# Patient Record
Sex: Female | Born: 1975 | Race: Asian | Hispanic: No | Marital: Married | Smoking: Never smoker
Health system: Southern US, Community
[De-identification: ages and names within clinical notes are randomized; demographics above are authoritative.]

## PROBLEM LIST (undated history)

## (undated) DIAGNOSIS — Z98891 History of uterine scar from previous surgery: Secondary | ICD-10-CM

## (undated) DIAGNOSIS — IMO0002 Reserved for concepts with insufficient information to code with codable children: Secondary | ICD-10-CM

## (undated) DIAGNOSIS — K219 Gastro-esophageal reflux disease without esophagitis: Secondary | ICD-10-CM

## (undated) HISTORY — PX: WISDOM TOOTH EXTRACTION: SHX21

## (undated) HISTORY — DX: Reserved for concepts with insufficient information to code with codable children: IMO0002

## (undated) HISTORY — DX: Gastro-esophageal reflux disease without esophagitis: K21.9

## (undated) HISTORY — PX: UPPER GASTROINTESTINAL ENDOSCOPY: SHX188

---

## 2014-07-01 DIAGNOSIS — K2970 Gastritis, unspecified, without bleeding: Secondary | ICD-10-CM

## 2014-07-01 HISTORY — DX: Gastritis, unspecified, without bleeding: K29.70

## 2014-12-09 ENCOUNTER — Ambulatory Visit (INDEPENDENT_AMBULATORY_CARE_PROVIDER_SITE_OTHER): Payer: 59 | Admitting: Family

## 2014-12-09 ENCOUNTER — Encounter: Payer: Self-pay | Admitting: Family

## 2014-12-09 ENCOUNTER — Other Ambulatory Visit (INDEPENDENT_AMBULATORY_CARE_PROVIDER_SITE_OTHER): Payer: 59

## 2014-12-09 VITALS — BP 100/68 | HR 87 | Temp 98.0°F | Resp 18 | Ht 62.0 in | Wt <= 1120 oz

## 2014-12-09 DIAGNOSIS — R1013 Epigastric pain: Secondary | ICD-10-CM

## 2014-12-09 LAB — LIPASE: Lipase: 17 U/L (ref 11.0–59.0)

## 2014-12-09 LAB — HEPATIC FUNCTION PANEL
ALBUMIN: 4 g/dL (ref 3.5–5.2)
ALK PHOS: 54 U/L (ref 39–117)
ALT: 20 U/L (ref 0–35)
AST: 13 U/L (ref 0–37)
BILIRUBIN TOTAL: 0.4 mg/dL (ref 0.2–1.2)
Bilirubin, Direct: 0.1 mg/dL (ref 0.0–0.3)
Total Protein: 6.5 g/dL (ref 6.0–8.3)

## 2014-12-09 LAB — AMYLASE: AMYLASE: 45 U/L (ref 27–131)

## 2014-12-09 LAB — H. PYLORI ANTIBODY, IGG: H PYLORI IGG: NEGATIVE

## 2014-12-09 MED ORDER — NORETHINDRONE ACET-ETHINYL EST 1-20 MG-MCG PO TABS: 1.0000 | ORAL_TABLET | Freq: Every day | ORAL | Status: DC

## 2014-12-09 MED ORDER — OMEPRAZOLE 20 MG PO CPDR: 20.0000 mg | DELAYED_RELEASE_CAPSULE | Freq: Every day | ORAL | Status: DC

## 2014-12-09 NOTE — Progress Notes (Signed)
Pre visit review using our clinic review tool, if applicable. No additional management support is needed unless otherwise documented below in the visit note. 

## 2014-12-09 NOTE — Patient Instructions (Addendum)
Gastroesophageal Reflux Disease, Adult Gastroesophageal reflux disease (GERD) happens when acid from your stomach flows up into the esophagus. When acid comes in contact with the esophagus, the acid causes soreness (inflammation) in the esophagus. Over time, GERD may create small holes (ulcers) in the lining of the esophagus. CAUSES   Increased body weight. This puts pressure on the stomach, making acid rise from the stomach into the esophagus.  Smoking. This increases acid production in the stomach.  Drinking alcohol. This causes decreased pressure in the lower esophageal sphincter (valve or ring of muscle between the esophagus and stomach), allowing acid from the stomach into the esophagus.  Late evening meals and a full stomach. This increases pressure and acid production in the stomach.  A malformed lower esophageal sphincter. Sometimes, no cause is found. SYMPTOMS   Burning pain in the lower part of the mid-chest behind the breastbone and in the mid-stomach area. This may occur twice a week or more often.  Trouble swallowing.  Sore throat.  Dry cough.  Asthma-like symptoms including chest tightness, shortness of breath, or wheezing. DIAGNOSIS  Your caregiver may be able to diagnose GERD based on your symptoms. In some cases, X-rays and other tests may be done to check for complications or to check the condition of your stomach and esophagus. TREATMENT  Your caregiver may recommend over-the-counter or prescription medicines to help decrease acid production. Ask your caregiver before starting or adding any new medicines.  HOME CARE INSTRUCTIONS   Change the factors that you can control. Ask your caregiver for guidance concerning weight loss, quitting smoking, and alcohol consumption.  Avoid foods and drinks that make your symptoms worse, such as:  Caffeine or alcoholic drinks.  Chocolate.  Peppermint or mint flavorings.  Garlic and onions.  Spicy foods.  Citrus fruits,  such as oranges, lemons, or limes.  Tomato-based foods such as sauce, chili, salsa, and pizza.  Fried and fatty foods.  Avoid lying down for the 3 hours prior to your bedtime or prior to taking a nap.  Eat small, frequent meals instead of large meals.  Wear loose-fitting clothing. Do not wear anything tight around your waist that causes pressure on your stomach.  Raise the head of your bed 6 to 8 inches with wood blocks to help you sleep. Extra pillows will not help.  Only take over-the-counter or prescription medicines for pain, discomfort, or fever as directed by your caregiver.  Do not take aspirin, ibuprofen, or other nonsteroidal anti-inflammatory drugs (NSAIDs). SEEK IMMEDIATE MEDICAL CARE IF:   You have pain in your arms, neck, jaw, teeth, or back.  Your pain increases or changes in intensity or duration.  You develop nausea, vomiting, or sweating (diaphoresis).  You develop shortness of breath, or you faint.  Your vomit is green, yellow, black, or looks like coffee grounds or blood.  Your stool is red, bloody, or black. These symptoms could be signs of other problems, such as heart disease, gastric bleeding, or esophageal bleeding. MAKE SURE YOU:   Understand these instructions.  Will watch your condition.  Will get help right away if you are not doing well or get worse. Document Released: 03/27/2005 Document Revised: 09/09/2011 Document Reviewed: 01/04/2011 [facility] Patient Information 2015 [place], [place]. This information is not intended to replace advice given to you by your health care provider. Make sure you discuss any questions you have with your health care provider.  Peptic Ulcer A peptic ulcer is a sore in the lining of your esophagus (esophageal  ulcer), stomach (gastric ulcer), or in the first part of your small intestine (duodenal ulcer). The ulcer causes erosion into the deeper tissue. CAUSES  Normally, the lining of the stomach and the small  intestine protects itself from the acid that digests food. The protective lining can be damaged by:  An infection caused by a bacterium called Helicobacter pylori (H. pylori).  Regular use of nonsteroidal anti-inflammatory drugs (NSAIDs), such as ibuprofen or aspirin.  Smoking tobacco. Other risk factors include being older than 38, drinking alcohol excessively, and having a family history of ulcer disease.  SYMPTOMS   Burning pain or gnawing in the area between the chest and the belly button.  Heartburn.  Nausea and vomiting.  Bloating. The pain can be worse on an empty stomach and at night. If the ulcer results in bleeding, it can cause:  Black, tarry stools.  Vomiting of bright red blood.  Vomiting of coffee-ground-looking materials. DIAGNOSIS  A diagnosis is usually made based upon your history and an exam. Other tests and procedures may be performed to find the cause of the ulcer. Finding a cause will help determine the best treatment. Tests and procedures may include:  Blood tests, stool tests, or breath tests to check for the bacterium H. pylori.  An upper gastrointestinal (GI) series of the esophagus, stomach, and small intestine.  An endoscopy to examine the esophagus, stomach, and small intestine.  A biopsy. TREATMENT  Treatment may include:  Eliminating the cause of the ulcer, such as smoking, NSAIDs, or alcohol.  Medicines to reduce the amount of acid in your digestive tract.  Antibiotic medicines if the ulcer is caused by the H. pylori bacterium.  An upper endoscopy to treat a bleeding ulcer.  Surgery if the bleeding is severe or if the ulcer created a hole somewhere in the digestive system. HOME CARE INSTRUCTIONS   Avoid tobacco, alcohol, and caffeine. Smoking can increase the acid in the stomach, and continued smoking will impair the healing of ulcers.  Avoid foods and drinks that seem to cause discomfort or aggravate your ulcer.  Only take medicines  as directed by your caregiver. Do not substitute over-the-counter medicines for prescription medicines without talking to your caregiver.  Keep any follow-up appointments and tests as directed. SEEK MEDICAL CARE IF:   Your do not improve within 7 days of starting treatment.  You have ongoing indigestion or heartburn. SEEK IMMEDIATE MEDICAL CARE IF:   You have sudden, sharp, or persistent abdominal pain.  You have bloody or dark black, tarry stools.  You vomit blood or vomit that looks like coffee grounds.  You become light-headed, weak, or feel faint.  You become sweaty or clammy. MAKE SURE YOU:   Understand these instructions.  Will watch your condition.  Will get help right away if you are not doing well or get worse. Document Released: 06/14/2000 Document Revised: 11/01/2013 Document Reviewed: 01/15/2012 [facility] Patient Information 2015 [place], [place]. This information is not intended to replace advice given to you by your health care provider. Make sure you discuss any questions you have with your health care provider.

## 2014-12-09 NOTE — Progress Notes (Signed)
Subjective:    Patient ID: Jessica Warner, female    DOB: 1975-07-02, 39 y.o.   MRN: [id number]  Chief Complaint  Patient presents with  . Establish Care    has been having abdominal pain for x2 months, its a constant pain, she feels it in the morning and before she goes to bed    HPI:  Jessica Warner is a 39 y.o. female with no significant PMH who presents today for an office visit to establish care.   1.) Abominal pain - This is a new problem. Associated symptom of pain located in the epigastric region of her stomach has been going on for a couple of months. Notes that she can feel it in the morning, at night before bed and on occasion when she eats too much. Pain is described as dull and gas like pain or fullness. Denies any constipation or diarrhea. Has not tried any over the counter medications or other treatments. Severity of the pain is rated 5-7/10. Pain is there constantly.    No Known Allergies   No outpatient prescriptions prior to visit.   No facility-administered medications prior to visit.     History reviewed. No pertinent past medical history.   Past Surgical History  Procedure Laterality Date  . Cesarean section       Family History  Problem Relation Age of Onset  . Healthy Mother   . Healthy Father   . Healthy Maternal Grandmother   . Healthy Maternal Grandfather   . Healthy Paternal Grandmother   . Healthy Paternal Grandfather      History   Social History  . Marital Status: Married    Spouse Name: N/A  . Number of Children: 1  . Years of Education: 68   Occupational History  . 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 Smoking status: Never Smoker   . Smokeless tobacco: Never Used  . Alcohol Use: No  . Drug Use: No  . Sexual Activity: Not on file   Other Topics Concern  . Not on file   Social History Narrative   Fun: Reading, cooking, walking.   Denies religious beliefs effecting health care.     Review of  Systems  Constitutional: Negative for fever and chills.  Gastrointestinal: Negative for nausea, vomiting, abdominal pain, constipation and abdominal distention.      Objective:    BP 100/68 mmHg  Pulse 87  Temp(Src) 98 F (36.7 C) (Oral)  Resp 18  Ht 5\' 2"  (1.575 m)  Wt 11 lb 1.9 oz (5.044 kg)  BMI 2.03 kg/m2  SpO2 99% Nursing note and vital signs reviewed.  Physical Exam  Constitutional: She is oriented to person, place, and time. She appears well-developed and well-nourished. No distress.  Cardiovascular: Normal rate, regular rhythm, normal heart sounds and intact distal pulses.   Pulmonary/Chest: Effort normal and breath sounds normal.  Abdominal: Soft. Normal appearance and bowel sounds are normal. She exhibits no mass. There is no hepatosplenomegaly. There is tenderness in the epigastric area. There is no rigidity, no rebound, no guarding, no tenderness at McBurney's point and negative Murphy's sign.  Neurological: She is alert and oriented to person, place, and time.  Skin: Skin is warm and dry.  Psychiatric: She has a normal mood and affect. Her behavior is normal. Judgment and thought content normal.       Assessment & Plan:   Problem List Items Addressed This Visit  Other   Epigastric abdominal pain - Primary    Symptoms and exam consistent with GERD, however cannot rule out peptic ulcer disease. Obtain Amylase, H. Pylori, hepatic function panel and lipase. Start omeprazole. Follow up pending lab work.       Relevant Orders   H. pylori antibody, IgG   Hepatic function panel   Lipase   Amylase

## 2014-12-09 NOTE — Assessment & Plan Note (Signed)
Symptoms and exam consistent with GERD, however cannot rule out peptic ulcer disease. Obtain Amylase, H. Pylori, hepatic function panel and lipase. Start omeprazole. Follow up pending lab work.

## 2014-12-10 ENCOUNTER — Telehealth: Payer: Self-pay | Admitting: Family

## 2014-12-10 NOTE — Telephone Encounter (Signed)
Please inform the patient that her blood work shows that her H. pylori which indicates potential peptic ulcer disease is negative. Also, her liver function amylase and lipase which also check pancreas and gallbladder are normal as well. Therefore please continue with the prescribed omeprazole and follow up if symptoms worsen or fail to improve. May also try a probiotic such as Activia yogurt or Align.

## 2014-12-12 NOTE — Telephone Encounter (Signed)
Pt aware of results 

## 2014-12-12 NOTE — Telephone Encounter (Signed)
LVM for pt to call back.

## 2014-12-23 ENCOUNTER — Ambulatory Visit: Payer: 59 | Admitting: Family

## 2015-01-11 ENCOUNTER — Ambulatory Visit (INDEPENDENT_AMBULATORY_CARE_PROVIDER_SITE_OTHER): Payer: 59 | Admitting: Family

## 2015-01-11 ENCOUNTER — Encounter: Payer: Self-pay | Admitting: Family

## 2015-01-11 VITALS — BP 102/74 | HR 91 | Temp 98.3°F | Resp 18 | Ht 62.0 in | Wt 114.0 lb

## 2015-01-11 DIAGNOSIS — R1013 Epigastric pain: Secondary | ICD-10-CM | POA: Diagnosis not present

## 2015-01-11 MED ORDER — OMEPRAZOLE 40 MG PO CPDR: 40.0000 mg | DELAYED_RELEASE_CAPSULE | Freq: Every day | ORAL | Status: DC

## 2015-01-11 NOTE — Assessment & Plan Note (Signed)
Symptoms remain unchanged with addition of omeprazole. Increase omeprazole to 40 mg daily. Concern remains for potential ulcer. Refer to GI for for further evaluation and possible endoscopy if indicated.

## 2015-01-11 NOTE — Progress Notes (Signed)
Pre visit review using our clinic review tool, if applicable. No additional management support is needed unless otherwise documented below in the visit note. 

## 2015-01-11 NOTE — Patient Instructions (Addendum)
Thank you for choosing [facility].  Summary/Instructions:  Increase omeprazole to 40 mg daily.  A referral to GI has been placed and the office will be in touch.   Your prescription(s) have been submitted to your pharmacy or been printed and provided for you. Please take as directed and contact our office if you believe you are having problem(s) with the medication(s) or have any questions.  If your symptoms worsen or fail to improve, please contact our office for further instruction, or in case of emergency go directly to the emergency room at the closest medical facility.

## 2015-01-11 NOTE — Progress Notes (Signed)
   Subjective:    Patient ID: Jessica Warner, female    DOB: [birth year], 39 y.o.   MRN: [id number]  Chief Complaint  Patient presents with  . Follow-up    still having same symptoms with abdominal pain, nothing has really progressed since last visit    HPI:  Jessica Warner is a 39 y.o. female with a PMH of epigastric pain who presents today for an office follow.   Previously seen in the office with epigastric pain and lab work including H. Pylori, lipase and amylase were all negative. Continues to experience the associated symptom of sharp epigastric pain. Indicates that she notices that the pain occurs when her stomach is empty and notes that there is improvement a few hours after eating.   No Known Allergies   Current Outpatient Prescriptions on File Prior to Visit  Medication Sig Dispense Refill  . norethindrone-ethinyl estradiol (JUNEL 1/20) 1-20 MG-MCG tablet Take 1 tablet by mouth daily. 1 Package 11   No current facility-administered medications on file prior to visit.     Review of Systems  Constitutional: Negative for fever and chills.  Gastrointestinal: Positive for abdominal pain. Negative for nausea, vomiting and constipation.      Objective:    BP 102/74 mmHg  Pulse 91  Temp(Src) 98.3 F (36.8 C) (Oral)  Resp 18  Ht 5\' 2"  (1.575 m)  Wt 114 lb (51.71 kg)  BMI 20.85 kg/m2  SpO2 96% Nursing note and vital signs reviewed.  Physical Exam  Constitutional: She is oriented to person, place, and time. She appears well-developed and well-nourished. No distress.  Cardiovascular: Normal rate, regular rhythm, normal heart sounds and intact distal pulses.   Pulmonary/Chest: Effort normal and breath sounds normal.  Abdominal: Soft. Normal appearance and bowel sounds are normal. There is tenderness in the epigastric area. There is no rigidity, no rebound, no guarding, no CVA tenderness, no tenderness at McBurney's point and negative Murphy's sign.  Neurological: She is  alert and oriented to person, place, and time.  Skin: Skin is warm and dry.  Psychiatric: She has a normal mood and affect. Her behavior is normal. Judgment and thought content normal.       Assessment & Plan:   Problem List Items Addressed This Visit      Other   Epigastric abdominal pain - Primary    Symptoms remain unchanged with addition of omeprazole. Increase omeprazole to 40 mg daily. Concern remains for potential ulcer. Refer to GI for for further evaluation and possible endoscopy if indicated.      Relevant Medications   omeprazole (PRILOSEC) 40 MG capsule

## 2015-02-01 ENCOUNTER — Encounter: Payer: Self-pay | Admitting: Family

## 2015-02-01 DIAGNOSIS — R1013 Epigastric pain: Secondary | ICD-10-CM

## 2015-02-20 ENCOUNTER — Ambulatory Visit: Payer: 59 | Admitting: Gastroenterology

## 2015-03-14 ENCOUNTER — Ambulatory Visit (INDEPENDENT_AMBULATORY_CARE_PROVIDER_SITE_OTHER): Payer: 59 | Admitting: Gastroenterology

## 2015-03-14 ENCOUNTER — Other Ambulatory Visit (INDEPENDENT_AMBULATORY_CARE_PROVIDER_SITE_OTHER): Payer: 59

## 2015-03-14 ENCOUNTER — Encounter: Payer: Self-pay | Admitting: Gastroenterology

## 2015-03-14 VITALS — BP 90/60 | HR 92 | Ht 62.0 in | Wt 113.0 lb

## 2015-03-14 DIAGNOSIS — R1013 Epigastric pain: Secondary | ICD-10-CM | POA: Diagnosis not present

## 2015-03-14 DIAGNOSIS — R195 Other fecal abnormalities: Secondary | ICD-10-CM

## 2015-03-14 LAB — CBC WITH DIFFERENTIAL/PLATELET
Basophils Absolute: 0 10*3/uL (ref 0.0–0.1)
Basophils Relative: 0.7 % (ref 0.0–3.0)
EOS PCT: 0.9 % (ref 0.0–5.0)
Eosinophils Absolute: 0.1 10*3/uL (ref 0.0–0.7)
HCT: 40.8 % (ref 36.0–46.0)
HEMOGLOBIN: 13.8 g/dL (ref 12.0–15.0)
Lymphocytes Relative: 26.7 % (ref 12.0–46.0)
Lymphs Abs: 1.8 10*3/uL (ref 0.7–4.0)
MCHC: 33.7 g/dL (ref 30.0–36.0)
MCV: 90.3 fl (ref 78.0–100.0)
Monocytes Absolute: 0.4 10*3/uL (ref 0.1–1.0)
Monocytes Relative: 5.4 % (ref 3.0–12.0)
Neutro Abs: 4.5 10*3/uL (ref 1.4–7.7)
Neutrophils Relative %: 66.3 % (ref 43.0–77.0)
Platelets: 264 10*3/uL (ref 150.0–400.0)
RBC: 4.52 Mil/uL (ref 3.87–5.11)
RDW: 13.2 % (ref 11.5–15.5)
WBC: 6.8 10*3/uL (ref 4.0–10.5)

## 2015-03-14 LAB — BASIC METABOLIC PANEL
BUN: 13 mg/dL (ref 6–23)
CALCIUM: 9.3 mg/dL (ref 8.4–10.5)
CO2: 30 meq/L (ref 19–32)
CREATININE: 0.64 mg/dL (ref 0.40–1.20)
Chloride: 104 mEq/L (ref 96–112)
GFR: 110.25 mL/min (ref >60.00)
GLUCOSE: 93 mg/dL (ref 70–99)
Potassium: 3.8 mEq/L (ref 3.5–5.1)
Sodium: 140 mEq/L (ref 135–145)

## 2015-03-14 LAB — TSH: TSH: 1.27 u[IU]/mL (ref 0.35–4.50)

## 2015-03-14 NOTE — Progress Notes (Signed)
Reviewed and agree with management plan.  Curry Seefeldt T. Kamariya Blevens, MD FACG 

## 2015-03-14 NOTE — Progress Notes (Signed)
03/14/2015 Jessica Warner [id number] 02/15/1976   HISTORY OF PRESENT ILLNESS:  This is a pleasant 39 year old female who is new to our practice. 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 She moved here in January from [place], [place] for her work. She has been referred here by her PCP, Dr.Calone, for evaluation of epigastric abdominal pain. She reports the pain has been present since approximately March, so about 6 months now. It stays in her epigastrium and radiates on occasion to left upper quadrant. The pain is a constant dull pain, but worse in the mornings and when she has an empty stomach. Seems to improve with eating. She says that she had an EGD a couple of years ago through [facility] that was reportedly normal/negative. This is performed for complaints of similar pain. The pain then resolved on its own after a couple of months but has now returned again recently. She denies any NSAID use. H. pylori serology was negative recently. Amylase, lipase, and hepatic function panel were normal. She was placed on omeprazole 40 mg daily, which she took for approximately one month and then increased it to 2 pills daily for approximately 6 weeks, but had no improvement in her symptoms with that regimen. She denies any nausea, vomiting, weight loss, or heartburn/reflux.  She also reports some loose stools, which actually began prior to the complaints of abdominal pain. For the last several months her stools are always loose, but she only reports 1 bowel movement every day to every other day. She denies seeing any blood in her stools.   History reviewed. No pertinent past medical history. Past Surgical History  Procedure Laterality Date  . Cesarean section      reports that she has never smoked. She has never used smokeless tobacco. She reports that she does not drink alcohol or use illicit drugs. family history includes Healthy in her father, maternal grandfather, maternal grandmother,  mother, paternal grandfather, and paternal grandmother. There is no history of Kidney disease, Liver disease, Colon cancer, Esophageal cancer, or Pancreatic cancer. No Known Allergies    Outpatient Encounter Prescriptions as of 03/14/2015  Medication Sig  . norethindrone-ethinyl estradiol (JUNEL 1/20) 1-20 MG-MCG tablet Take 1 tablet by mouth daily.  . [DISCONTINUED] omeprazole (PRILOSEC) 40 MG capsule Take 1 capsule (40 mg total) by mouth daily.   No facility-administered encounter medications on file as of 03/14/2015.     REVIEW OF SYSTEMS  : All other systems reviewed and negative except where noted in the History of Present Illness.   PHYSICAL EXAM: BP 90/60 mmHg  Pulse 92  Ht 5\' 2"  (1.575 m)  Wt 113 lb (51.256 kg)  BMI 20.66 kg/m2 General: Well developed female in no acute distress Head: Normocephalic and atraumatic Eyes:  Sclerae anicteric, conjunctiva pink. Ears: Normal auditory acuity Lungs: Clear throughout to auscultation Heart: Regular rate and rhythm Abdomen: Soft, non-distended.  Normal bowel sounds.  Epigastric TTP without R/R/G. Musculoskeletal: Symmetrical with no gross deformities  Skin: No lesions on visible extremities Extremities: No edema  Neurological: Alert oriented x 4, grossly non-focal Psychological:  Alert and cooperative. Normal mood and affect  ASSESSMENT AND PLAN: -Epigastric abdominal pain:  6 months duration.  Had similar pain a couple of years ago with reportedly negative EGD at [facility].  Symptoms resolved until recently.  Hpylori serology negative as is hepatic function panel, lipase, and amylase.  No improvement on PPI.  Will repeat EGD to rule out ulcer disease, etc.  If EGD  negative then would consider CT scan of the abdomen.  We have requested EGD results/GI records from [facility]. -Loose stool:  Only one BM every day or every other day, but change in form/consistency.  Will check TSH.  Will also check CBC and BMP for completion.   Consider starting daily fiber supplement to bulk the stool.   CC:  Golden Circle, FNP

## 2015-03-14 NOTE — Patient Instructions (Signed)
You have been scheduled for an endoscopy. Please follow written instructions given to you at your visit today. If you use inhalers (even only as needed), please bring them with you on the day of your procedure. Your physician has requested that you go to www.startemmi.com and enter the access code given to you at your visit today. This web site gives a general overview about your procedure. However, you should still follow specific instructions given to you by our office regarding your preparation for the procedure.  Your physician has requested that you go to the basement for lab work before leaving today.   

## 2015-03-31 ENCOUNTER — Telehealth: Payer: Self-pay

## 2015-03-31 ENCOUNTER — Other Ambulatory Visit: Payer: Self-pay

## 2015-03-31 ENCOUNTER — Ambulatory Visit (AMBULATORY_SURGERY_CENTER): Payer: 59 | Admitting: Gastroenterology

## 2015-03-31 ENCOUNTER — Encounter: Payer: Self-pay | Admitting: Gastroenterology

## 2015-03-31 VITALS — BP 93/59 | HR 73 | Temp 98.5°F | Resp 16 | Ht 62.0 in | Wt 113.0 lb

## 2015-03-31 DIAGNOSIS — K2970 Gastritis, unspecified, without bleeding: Secondary | ICD-10-CM | POA: Diagnosis not present

## 2015-03-31 DIAGNOSIS — R1013 Epigastric pain: Secondary | ICD-10-CM | POA: Diagnosis present

## 2015-03-31 DIAGNOSIS — R1084 Generalized abdominal pain: Secondary | ICD-10-CM

## 2015-03-31 MED ORDER — PANTOPRAZOLE SODIUM 40 MG PO TBEC: 40.0000 mg | DELAYED_RELEASE_TABLET | Freq: Every day | ORAL | Status: DC

## 2015-03-31 MED ORDER — SODIUM CHLORIDE 0.9 % IV SOLN: 500.0000 mL | INTRAVENOUS | Status: DC

## 2015-03-31 NOTE — Progress Notes (Signed)
Transferred to recovery room. A/O x3, pleased with MAC.  VSS.  Report to Suzanne, RN. 

## 2015-03-31 NOTE — Progress Notes (Signed)
Called to room to assist during endoscopic procedure.  Patient ID and intended procedure confirmed with present staff. Received instructions for my participation in the procedure from the performing physician.  

## 2015-03-31 NOTE — Op Note (Signed)
[facility]  [address] [place], [address]   ENDOSCOPY PROCEDURE REPORT  PATIENT: Jessica Warner, Jessica Warner  MR#: [id number] BIRTHDATE: 06-27-1976 , 39  yrs. old GENDER: female ENDOSCOPIST: Ladene Artist, MD, [facility] REFERRED BY:  Mauricio Po, FNP PROCEDURE DATE:  03/31/2015 PROCEDURE:  EGD w/ biopsy ASA CLASS:     Class I INDICATIONS:  epigastric pain. MEDICATIONS: Monitored anesthesia care and Propofol 150 mg IV TOPICAL ANESTHETIC: none DESCRIPTION OF PROCEDURE: After the risks benefits and alternatives of the procedure were thoroughly explained, informed consent was obtained.  The LB [id number] endoscope was introduced through the mouth and advanced to the second portion of the duodenum , Without limitations.  The instrument was slowly withdrawn as the mucosa was fully examined.    STOMACH: Mild gastritis was found in the gastric body and gastric antrum. A few small erosions and mild erythema. Multiple biopsies were performed.   The stomach otherwise appeared normal. ESOPHAGUS: The mucosa of the esophagus appeared normal. DUODENUM: The duodenal mucosa showed no abnormalities in the bulb and 2nd part of the duodenum.  Retroflexed views revealed no abnormalities.  The scope was then withdrawn from the patient and the procedure completed.  COMPLICATIONS: There were no immediate complications.  ENDOSCOPIC IMPRESSION: 1.   Mild gastritis in the gastric body and gastric antrum; multiple biopsies performed 2.   The EGD otherwise appeared normal  RECOMMENDATIONS: 1.  Avoid NSAIDS 2.  PPI qam: pantoprazole 40 mg po qam, 1 year of refills 3.  Schedule abdominal ultrasound 4.  Await pathology 5.  Office visit in 6 weeks  eSigned:  Ladene Artist, MD, [facility] 03/31/2015 10:16 AM

## 2015-03-31 NOTE — Patient Instructions (Signed)
YOU HAD AN ENDOSCOPIC PROCEDURE TODAY AT [facility]:   Refer to the procedure report that was given to you for any specific questions about what was found during the examination.  If the procedure report does not answer your questions, please call your gastroenterologist to clarify.  If you requested that your care partner not be given the details of your procedure findings, then the procedure report has been included in a sealed envelope for you to review at your convenience later.  YOU SHOULD EXPECT: Some feelings of bloating in the abdomen. Passage of more gas than usual.  Walking can help get rid of the air that was put into your GI tract during the procedure and reduce the bloating.   Please Note:  You might notice some irritation and congestion in your nose or some drainage.  This is from the oxygen used during your procedure.  There is no need for concern and it should clear up in a day or so.  SYMPTOMS TO REPORT IMMEDIATELY:   Following upper endoscopy (EGD)  Vomiting of blood or coffee ground material  New chest pain or pain under the shoulder blades  Painful or persistently difficult swallowing  New shortness of breath  Fever of 100F or higher  Black, tarry-looking stools  For urgent or emergent issues, a gastroenterologist can be reached at any hour by calling [phone].   DIET: Your first meal following the procedure should be a small meal and then it is ok to progress to your normal diet. Heavy or fried foods are harder to digest and may make you feel nauseous or bloated.  Likewise, meals heavy in dairy and vegetables can increase bloating.  Drink plenty of fluids but you should avoid alcoholic beverages for 24 hours.  ACTIVITY:  You should plan to take it easy for the rest of today and you should NOT DRIVE or use heavy machinery until tomorrow (because of the sedation medicines used during the test).    FOLLOW UP: Our staff will call the number listed on  your records the next business day following your procedure to check on you and address any questions or concerns that you may have regarding the information given to you following your procedure. If we do not reach you, we will leave a message.  However, if you are feeling well and you are not experiencing any problems, there is no need to return our call.  We will assume that you have returned to your regular daily activities without incident.  If any biopsies were taken you will be contacted by phone or by letter within the next 1-3 weeks.  Please call us at [phone] if you have not heard about the biopsies in 3 weeks.    SIGNATURES/CONFIDENTIALITY: You and/or your care partner have signed paperwork which will be entered into your electronic medical record.  These signatures attest to the fact that that the information above on your After Visit Summary has been reviewed and is understood.  Full responsibility of the confidentiality of this discharge information lies with you and/or your care-partner.  Take your pantoprazole 30 mins before breakfast every morning.  Avoid NSAIDS per Dr. Fuller Plan. The 3rd floor staff will call you to arrange an ultrasound.

## 2015-03-31 NOTE — Telephone Encounter (Signed)
Pt scheduled for [place] of abdomen at [facility] 04/07/15@7 :30am, pt to arrive there at 7:15am. Pt to be NPO after midnight. Pt scheduled for OV with Dr. Fuller Plan 05/11/15@9 :45am. [facility] RN Vinnie Level to notify pt of appts.

## 2015-04-03 ENCOUNTER — Telehealth: Payer: Self-pay | Admitting: *Deleted

## 2015-04-03 NOTE — Telephone Encounter (Signed)
  Follow up Call-  Call back number 03/31/2015  Post procedure Call Back phone  # [phone] cell  Permission to leave phone message Yes     Patient questions:  Do you have a fever, pain , or abdominal swelling? No. Pain Score  0 *  Have you tolerated food without any problems? Yes.    Have you been able to return to your normal activities? Yes.    Do you have any questions about your discharge instructions: Diet   No. Medications  No. Follow up visit  No.  Do you have questions or concerns about your Care? No.  Actions: * If pain score is 4 or above: No action needed, pain <4.

## 2015-04-04 ENCOUNTER — Encounter: Payer: Self-pay | Admitting: Gastroenterology

## 2015-04-07 ENCOUNTER — Ambulatory Visit (HOSPITAL_COMMUNITY)
Admission: RE | Admit: 2015-04-07 | Discharge: 2015-04-07 | Disposition: A | Discharge status: Home / Self Care | Payer: 59 | Source: Ambulatory Visit | Attending: Gastroenterology | Admitting: Gastroenterology

## 2015-04-07 DIAGNOSIS — R1084 Generalized abdominal pain: Secondary | ICD-10-CM

## 2015-04-07 DIAGNOSIS — R109 Unspecified abdominal pain: Secondary | ICD-10-CM | POA: Diagnosis present

## 2015-05-10 ENCOUNTER — Telehealth: Payer: Self-pay | Admitting: Family

## 2015-05-10 NOTE — Telephone Encounter (Signed)
Informed pt on vm °

## 2015-05-10 NOTE — Telephone Encounter (Signed)
Ok with me 

## 2015-05-10 NOTE — Telephone Encounter (Signed)
Patient would like to transfer from [facility] to Dr. Quay Burow. She is looking for a female provider. Please advise

## 2015-05-11 ENCOUNTER — Encounter: Payer: Self-pay | Admitting: Gastroenterology

## 2015-05-11 ENCOUNTER — Ambulatory Visit (INDEPENDENT_AMBULATORY_CARE_PROVIDER_SITE_OTHER): Payer: 59 | Admitting: Gastroenterology

## 2015-05-11 VITALS — BP 90/70 | HR 88 | Ht 62.0 in | Wt 113.1 lb

## 2015-05-11 DIAGNOSIS — R195 Other fecal abnormalities: Secondary | ICD-10-CM

## 2015-05-11 DIAGNOSIS — R1013 Epigastric pain: Secondary | ICD-10-CM

## 2015-05-11 DIAGNOSIS — R197 Diarrhea, unspecified: Secondary | ICD-10-CM

## 2015-05-11 MED ORDER — HYOSCYAMINE SULFATE 0.125 MG SL SUBL: SUBLINGUAL_TABLET | SUBLINGUAL | Status: DC

## 2015-05-11 NOTE — Patient Instructions (Signed)
We have sent the following medications to your pharmacy for you to pick up at your convenience:Levsin.  Remain on a lactose free diet x 7-10 days.   Start a probiotic such as Air traffic controller, phillips colon health daily for 2 weeks.   Stop taking your Protonix in one month.   Thank you for choosing me and [facility].  Pricilla Riffle. Dagoberto Ligas., MD., Marval Regal

## 2015-05-11 NOTE — Progress Notes (Addendum)
    History of Present Illness: This is a 39 year old female returning for follow-up of epigastric pain and loose stools. Both complaints have been ongoing for many months. Her epigastric pain has improved but not resolved on daily pantoprazole. 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 H pylori stain was negative. Recent abdominal ultrasound was normal. Recent blood work was normal. She has a bowel movement every day to every other day and she notes it is somewhat looser than it was a few years ago and this pattern has been present for at least 8 or 9 months without changing.  Current Medications, Allergies, Past Medical History, Past Surgical History, Family History and Social History were reviewed in [employer] record.  Physical Exam: General: Well developed, well nourished, no acute distress Head: Normocephalic and atraumatic Eyes:  sclerae anicteric, EOMI Ears: Normal auditory acuity Mouth: No deformity or lesions Lungs: Clear throughout to auscultation Heart: Regular rate and rhythm; no murmurs, rubs or bruits Abdomen: Soft, non tender and non distended. No masses, hepatosplenomegaly or hernias noted. Normal Bowel sounds Musculoskeletal: Symmetrical with no gross deformities  Pulses:  Normal pulses noted Extremities: No clubbing, cyanosis, edema or deformities noted Neurological: Alert oriented x 4, grossly nonfocal Psychological:  Alert and cooperative. Normal mood and affect  Assessment and Recommendations:  1. Epigastric pain, loose stools and gastritis. Continue pantoprazole for a 2 month course and then discontinue. Will resume pantoprazole if her symptoms worsen. Trial of Levsin 1-2 every 4 hours as needed for abdominal pain. Advised to begin a 7-10 day trial of a lactose free diet. If loose stools persist begin a trial of 3 different probiotics each for 2-3  weeks to determine if this will impact her looser stools. REV in 2-3 months.

## 2015-06-12 ENCOUNTER — Encounter: Payer: Self-pay | Admitting: Internal Medicine

## 2015-06-12 ENCOUNTER — Encounter: Payer: Self-pay | Admitting: Gastroenterology

## 2015-06-12 ENCOUNTER — Ambulatory Visit (INDEPENDENT_AMBULATORY_CARE_PROVIDER_SITE_OTHER): Payer: 59 | Admitting: Internal Medicine

## 2015-06-12 VITALS — BP 108/74 | HR 93 | Temp 98.1°F | Resp 16 | Wt 111.0 lb

## 2015-06-12 DIAGNOSIS — M549 Dorsalgia, unspecified: Secondary | ICD-10-CM

## 2015-06-12 DIAGNOSIS — R42 Dizziness and giddiness: Secondary | ICD-10-CM

## 2015-06-12 DIAGNOSIS — R1013 Epigastric pain: Secondary | ICD-10-CM | POA: Diagnosis not present

## 2015-06-12 DIAGNOSIS — R5383 Other fatigue: Secondary | ICD-10-CM | POA: Diagnosis not present

## 2015-06-12 DIAGNOSIS — G8929 Other chronic pain: Secondary | ICD-10-CM

## 2015-06-12 DIAGNOSIS — M546 Pain in thoracic spine: Secondary | ICD-10-CM | POA: Diagnosis not present

## 2015-06-12 NOTE — Progress Notes (Signed)
Subjective:    Patient ID: Jessica Warner, female    DOB: [birth year], 39 y.o.   MRN: [id number]  HPI  She is here to establish with a new primary care physician-she prefers to see a female. She has a couple of concerns today.  She feels tired at the end of day and sometimes in the morning. The fatigue is not consistent-it is on occasion.  She typically sleeps 6-7 hours at night, her average is probably 7. She feels her sleep quality is good. She is not currently exercising regularly, but that is because she recently fell and injured her tailbone. That is getting better and she plans on returning to regular exercise.  Epigastric abdominal pain: She has been experiencing epigastric pain. She did see GI and had an EGD done in September. She had some mild gastritis. She has been taking pantoprazole daily and she feels this has helped. She sometimes feels abdominal pain first thing in the morning, but typically eating helps. She will also have pain if she presses hard in the area. She does not have pain on a daily basis. She plans on following up with GI regarding what she should do next and if she should continue the medication.   Never back pain: She has been experiencing neck and upper back pain for a while. She feels it is chronic muscle tightening. She rubs the area and does some stretching and it does help. She does get some headaches on occasion may be related to the neck and back pain. She does sit at a computer for several hours during the day and she realizes that may influence it.   Two weeks ago she fell from stairs and hurt her tailbone.  It is getting better.    Dizziness: On occasion she will experience some dizziness. She denies any relation to head movements. She denies any relation to specific activities. It is intermittent and very transient.      Medications and allergies reviewed with patient and no updates were needed.  Patient Active Problem List   Diagnosis Date Noted  .  Abdominal pain, epigastric 03/14/2015  . Loose stools 03/14/2015  . Epigastric abdominal pain 12/09/2014    Current Outpatient Prescriptions on File Prior to Visit  Medication Sig Dispense Refill  . hyoscyamine (LEVSIN SL) 0.125 MG SL tablet Take 1-2 capsules by mouth every 4 hours as needed for abd pain 100 tablet 11  . norethindrone-ethinyl estradiol (JUNEL 1/20) 1-20 MG-MCG tablet Take 1 tablet by mouth daily. 1 Package 11  . pantoprazole (PROTONIX) 40 MG tablet Take 1 tablet (40 mg total) by mouth daily. 90 tablet 3   No current facility-administered medications on file prior to visit.    Past Medical History  Diagnosis Date  . GERD (gastroesophageal reflux disease)     Past Surgical History  Procedure Laterality Date  . Cesarean section    . Wisdom tooth extraction    . Upper gastrointestinal endoscopy      Social History   Social History  . Marital Status: Married    Spouse Name: N/A  . Number of Children: 1  . Years of Education: 30   Occupational History  . 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 Smoking status: Never Smoker   . Smokeless tobacco: Never Used  . Alcohol Use: No  . Drug Use: No  . Sexual Activity: Not on file   Other Topics Concern  . Not on file  Social History Narrative   Fun: Reading, cooking, walking.   Denies religious beliefs effecting health care.     Review of Systems  Constitutional: Negative for fever and chills.  Respiratory: Negative for cough, shortness of breath and wheezing.   Cardiovascular: Negative for chest pain, palpitations and leg swelling.  Gastrointestinal: Positive for abdominal pain.       No GERD  Musculoskeletal: Positive for neck pain.  Neurological: Positive for dizziness (occasional) and headaches (occasional). Negative for weakness and numbness.       Objective:   Filed Vitals:   06/12/15 1038  BP: 108/74  Pulse: 93  Temp: 98.1 F (36.7 C)  Resp: 16   Filed  Weights   06/12/15 1038  Weight: 111 lb (50.349 kg)   Body mass index is 20.3 kg/(m^2).   Physical Exam Constitutional: Appears well-developed and well-nourished. No distress.  Neck: B/L ear canals and TM normal, Neck supple. No tracheal deviation present. No thyromegaly present.  No carotid bruit. No cervical adenopathy.   Cardiovascular: Normal rate, regular rhythm and normal heart sounds.   No murmur heard. Pulmonary/Chest: Effort normal and breath sounds normal. No respiratory distress. No wheezes.  Abdomen: soft, minimal epigastric tenderness without rebound or guarding, no HSM Musculoskeletal: No edema.        Assessment & Plan:   Epigastric pain Related to mild gastritis, which is likely healed Continue pantoprazole She plans on following up with GI Reassured her her stomach has likely healed  Dizziness Occasional Likely in her ear Given that he is only on occasion very transient left evaluate further, but if it becomes more than she can refer to neurology or ENT  Fatigue Reviewed recent blood work-all normal Encouraged 7-8 hours of sleep at night Restart regular exercise  Upper back and neck muscle tightness, chronic Discussed possible physical therapy referral, massage therapy, seen a chiropractor, acupuncture Stretching, heat, avoiding certain activities that promote the pain or at least make sure her work space is ergonomic  Follow-up as needed

## 2015-06-12 NOTE — Progress Notes (Signed)
Pre visit review using our clinic review tool, if applicable. No additional management support is needed unless otherwise documented below in the visit note. 

## 2015-06-12 NOTE — Patient Instructions (Addendum)
  We have reviewed your prior records including labs and tests today.   No immunizations administered today.   Medications reviewed and updated.  No changes recommended at this time.    [facility] [address] [place], [place] [address] from [place]  [facility] [place], [place]

## 2015-06-13 MED ORDER — PANTOPRAZOLE SODIUM 40 MG PO TBEC: 40.0000 mg | DELAYED_RELEASE_TABLET | Freq: Every day | ORAL | Status: DC

## 2015-12-04 ENCOUNTER — Other Ambulatory Visit: Payer: Self-pay | Admitting: Family

## 2015-12-20 ENCOUNTER — Encounter: Payer: Self-pay | Admitting: Gastroenterology

## 2015-12-25 DIAGNOSIS — K2950 Unspecified chronic gastritis without bleeding: Secondary | ICD-10-CM | POA: Diagnosis not present

## 2015-12-25 DIAGNOSIS — R1012 Left upper quadrant pain: Secondary | ICD-10-CM | POA: Diagnosis not present

## 2015-12-25 DIAGNOSIS — R1013 Epigastric pain: Secondary | ICD-10-CM | POA: Diagnosis not present

## 2016-01-20 DIAGNOSIS — H5213 Myopia, bilateral: Secondary | ICD-10-CM | POA: Diagnosis not present

## 2016-01-26 DIAGNOSIS — K2930 Chronic superficial gastritis without bleeding: Secondary | ICD-10-CM | POA: Diagnosis not present

## 2016-01-26 DIAGNOSIS — R1012 Left upper quadrant pain: Secondary | ICD-10-CM | POA: Diagnosis not present

## 2016-01-26 DIAGNOSIS — R1013 Epigastric pain: Secondary | ICD-10-CM | POA: Diagnosis not present

## 2016-02-12 ENCOUNTER — Ambulatory Visit: Payer: 59 | Admitting: Gastroenterology

## 2016-03-16 DIAGNOSIS — R8290 Unspecified abnormal findings in urine: Secondary | ICD-10-CM | POA: Diagnosis not present

## 2016-03-16 DIAGNOSIS — R300 Dysuria: Secondary | ICD-10-CM | POA: Diagnosis not present

## 2016-04-03 ENCOUNTER — Encounter: Payer: Self-pay | Admitting: Gastroenterology

## 2016-04-03 ENCOUNTER — Ambulatory Visit (INDEPENDENT_AMBULATORY_CARE_PROVIDER_SITE_OTHER): Payer: 59 | Admitting: Gastroenterology

## 2016-04-03 VITALS — BP 102/62 | HR 68 | Ht 62.0 in | Wt 107.8 lb

## 2016-04-03 DIAGNOSIS — K2940 Chronic atrophic gastritis without bleeding: Secondary | ICD-10-CM

## 2016-04-03 DIAGNOSIS — R1013 Epigastric pain: Secondary | ICD-10-CM

## 2016-04-03 NOTE — Patient Instructions (Signed)
We will get your records from the [facility].   Thank you for choosing me and [facility].  Pricilla Riffle. Dagoberto Ligas., MD., Marval Regal

## 2016-04-03 NOTE — Progress Notes (Signed)
    History of Present Illness: This is a 40 year old female complaining of mild intermittent epigastric pain. She states that she has mild epigastric pain prior to meals and her symptoms are relieved with a snack or a meal. She states that she discontinued pantoprazole for a couple months and her symptoms were more frequent. She was started on omeprazole 40 mg daily which she thinks has improved her symptoms. She takes Carafate about twice a day and she is unsure whether it is beneficial. Has many questions about her gastritis, gastric biopsies and symptoms. She states she had an endoscopy performed at the [facility] about 3 years ago. She is unsure as to what was found and if biopsies were obtained. CBC, CMP, TSH, amylase, lipase in 11/2014 were unremarkable.   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  H pylori Ab negative 11/2014  Current Medications, Allergies, Past Medical History, Past Surgical History, Family History and Social History were reviewed in [employer] record.  Physical Exam: General: Well developed, well nourished, no acute distress Head: Normocephalic and atraumatic Eyes:  sclerae anicteric, EOMI Ears: Normal auditory acuity Mouth: No deformity or lesions Lungs: Clear throughout to auscultation Heart: Regular rate and rhythm; no murmurs, rubs or bruits Abdomen: Soft, non tender and non distended. No masses, hepatosplenomegaly or hernias noted. Normal Bowel sounds Musculoskeletal: Symmetrical with no gross deformities  Pulses:  Normal pulses noted Extremities: No clubbing, cyanosis, edema or deformities noted Neurological: Alert oriented x 4, grossly nonfocal Psychological:  Alert and cooperative. Normal mood and affect  Assessment and Recommendations:  1. 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 Continue  omeprazole 40 mg daily. Carafate 4 times a day before meals prn epigastric pain. If not helpful then a trial of Tums prn epigastric pain. Consider a trial of FDgard if symptoms not responsive to above measures. Patient reassured that no further evaluation is needed at this time. EGD in 03/2020 for surveillance of intestinal metaplasia. Attempt to obtain her prior GI records including EGD and possible biopsies from [place]. REV in 1 year.  I spent 15 minutes of face-to-face time with the patient. Greater than 50% of the time was spent counseling and coordinating care.

## 2016-09-12 ENCOUNTER — Ambulatory Visit (INDEPENDENT_AMBULATORY_CARE_PROVIDER_SITE_OTHER): Payer: 59 | Admitting: Internal Medicine

## 2016-09-12 ENCOUNTER — Other Ambulatory Visit (INDEPENDENT_AMBULATORY_CARE_PROVIDER_SITE_OTHER): Payer: 59

## 2016-09-12 ENCOUNTER — Encounter: Payer: Self-pay | Admitting: Internal Medicine

## 2016-09-12 VITALS — BP 100/64 | HR 98 | Temp 97.8°F | Resp 16 | Ht 62.0 in | Wt 107.0 lb

## 2016-09-12 DIAGNOSIS — Z0000 Encounter for general adult medical examination without abnormal findings: Secondary | ICD-10-CM | POA: Diagnosis not present

## 2016-09-12 LAB — COMPREHENSIVE METABOLIC PANEL
ALT: 16 U/L (ref 0–35)
AST: 17 U/L (ref 0–37)
Albumin: 4.3 g/dL (ref 3.5–5.2)
Alkaline Phosphatase: 51 U/L (ref 39–117)
BUN: 14 mg/dL (ref 6–23)
CHLORIDE: 104 meq/L (ref 96–112)
CO2: 29 meq/L (ref 19–32)
Calcium: 9.6 mg/dL (ref 8.4–10.5)
Creatinine, Ser: 0.65 mg/dL (ref 0.40–1.20)
GFR: 107.46 mL/min (ref >60.00)
GLUCOSE: 77 mg/dL (ref 70–99)
POTASSIUM: 3.8 meq/L (ref 3.5–5.1)
SODIUM: 142 meq/L (ref 135–145)
Total Bilirubin: 0.6 mg/dL (ref 0.2–1.2)
Total Protein: 6.9 g/dL (ref 6.0–8.3)

## 2016-09-12 LAB — CBC WITH DIFFERENTIAL/PLATELET
Basophils Absolute: 0 10*3/uL (ref 0.0–0.1)
Basophils Relative: 0.8 % (ref 0.0–3.0)
EOS PCT: 1.2 % (ref 0.0–5.0)
Eosinophils Absolute: 0.1 10*3/uL (ref 0.0–0.7)
HCT: 42.1 % (ref 36.0–46.0)
Hemoglobin: 13.9 g/dL (ref 12.0–15.0)
LYMPHS ABS: 1.7 10*3/uL (ref 0.7–4.0)
Lymphocytes Relative: 29.5 % (ref 12.0–46.0)
MCHC: 33.1 g/dL (ref 30.0–36.0)
MCV: 92.8 fl (ref 78.0–100.0)
MONO ABS: 0.4 10*3/uL (ref 0.1–1.0)
Monocytes Relative: 6.2 % (ref 3.0–12.0)
NEUTROS ABS: 3.7 10*3/uL (ref 1.4–7.7)
NEUTROS PCT: 62.3 % (ref 43.0–77.0)
Platelets: 254 10*3/uL (ref 150.0–400.0)
RBC: 4.54 Mil/uL (ref 3.87–5.11)
RDW: 13.6 % (ref 11.5–15.5)
WBC: 5.9 10*3/uL (ref 4.0–10.5)

## 2016-09-12 LAB — LIPID PANEL
CHOL/HDL RATIO: 3
Cholesterol: 174 mg/dL (ref 0–200)
HDL: 61 mg/dL (ref >39.00)
LDL CALC: 89 mg/dL (ref 0–99)
NONHDL: 112.61
Triglycerides: 117 mg/dL (ref 0.0–149.0)
VLDL: 23.4 mg/dL (ref 0.0–40.0)

## 2016-09-12 LAB — VITAMIN D 25 HYDROXY (VIT D DEFICIENCY, FRACTURES): VITD: 19.88 ng/mL — ABNORMAL LOW (ref 30.00–100.00)

## 2016-09-12 LAB — TSH: TSH: 1.37 u[IU]/mL (ref 0.35–4.50)

## 2016-09-12 NOTE — Progress Notes (Signed)
Pre visit review using our clinic review tool, if applicable. No additional management support is needed unless otherwise documented below in the visit note. 

## 2016-09-12 NOTE — Progress Notes (Signed)
Subjective:    Patient ID: Jessica Warner, female    DOB: [birth year], 41 y.o.   MRN: [id number]  HPI She is here for a physical exam.   Chronic neck and back pain: she feels it is related to stress.  She has tried massage but it is temporary relief. She is trying to stretch regularly.  She has used a heating pad.   She had some epigastric pain in the past year ans did see GI.  She was on omeprazole and carafate.  She did not feel the medications were helping and stopped them.  She has occasional epigastric pain.  She finds if she eats frequently it helps.   She is thinking about getting pregnant.  She is not established with a gyn.   Medications and allergies reviewed with patient and updated if appropriate.  Patient Active Problem List   Diagnosis Date Noted  . Abdominal pain, epigastric 03/14/2015  . Loose stools 03/14/2015    No current outpatient prescriptions on file prior to visit.   No current facility-administered medications on file prior to visit.     Past Medical History:  Diagnosis Date  . GERD (gastroesophageal reflux disease)     Past Surgical History:  Procedure Laterality Date  . CESAREAN SECTION    . UPPER GASTROINTESTINAL ENDOSCOPY    . WISDOM TOOTH EXTRACTION      Social History   Social History  . Marital status: Married    Spouse name: N/A  . Number of children: 1  . 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 Smoking status: Never Smoker  . Smokeless tobacco: Never Used  . Alcohol use No  . Drug use: No  . Sexual activity: Not Asked   Other Topics Concern  . None   Social History Narrative   Fun: Reading, cooking, walking.   Denies religious beliefs effecting health care.           Family History  Problem Relation Age of Onset  . Healthy Mother   . Healthy Father   . Healthy Maternal Grandmother   . Healthy Maternal Grandfather   . Healthy Paternal  Grandmother   . Healthy Paternal Grandfather   . Colon cancer Neg Hx   . Esophageal cancer Neg Hx   . Pancreatic cancer Neg Hx   . Rectal cancer Neg Hx   . Stomach cancer Neg Hx     Review of Systems  Constitutional: Negative for appetite change, chills, fatigue and fever.  Eyes: Negative for visual disturbance.  Respiratory: Negative for cough, shortness of breath and wheezing.   Cardiovascular: Negative for chest pain, palpitations and leg swelling.  Gastrointestinal: Negative for abdominal pain, blood in stool, constipation, diarrhea and nausea.       GERD - sometimes  Genitourinary: Negative for dysuria and hematuria.  Musculoskeletal: Positive for arthralgias (occasional), back pain and neck pain.  Skin: Negative for color change and rash.  Neurological: Positive for headaches (occasional). Negative for dizziness and light-headedness.  Psychiatric/Behavioral: Negative for dysphoric mood. The patient is not nervous/anxious.        Objective:   Vitals:   09/12/16 0814  BP: 100/64  Pulse: 98  Resp: 16  Temp: 97.8 F (36.6 C)   Filed Weights   09/12/16 0814  Weight: 107 lb (48.5 kg)   Body mass index is 19.57 kg/m.  Wt Readings from Last 3 Encounters:  09/12/16 107 lb (48.5 kg)  04/03/16 107 lb 12.8 oz (48.9 kg)  06/12/15 111 lb (50.3 kg)     Physical Exam Constitutional: She appears well-developed and well-nourished. No distress.  HENT:  Head: Normocephalic and atraumatic.  Right Ear: External ear normal. Normal ear canal and TM Left Ear: External ear normal.  Normal ear canal and TM Mouth/Throat: Oropharynx is clear and moist.  Eyes: Conjunctivae and EOM are normal.  Neck: Neck supple. No tracheal deviation present. No thyromegaly present.  No carotid bruit  Cardiovascular: Normal rate, regular rhythm and normal heart sounds.   No murmur heard.  No edema. Pulmonary/Chest: Effort normal and breath sounds normal. No respiratory distress. She has no wheezes.  She has no rales.  Breast: deferred to Gyn Abdominal: Soft. She exhibits no distension. There is no tenderness.  Lymphadenopathy: She has no cervical adenopathy.  Skin: Skin is warm and dry. She is not diaphoretic.  Psychiatric: She has a normal mood and affect. Her behavior is normal.         Assessment & Plan:   Physical exam: Screening blood work ordered Immunizations  Up to date through work Gyn  - not up to date - will schedule Exercise - irregular exercise - encouraged regular exercise Weight - normal BMI Skin   No concerns Substance abuse  None  Will establish with gyn  See Problem List for Assessment and Plan of chronic medical problems.

## 2016-09-12 NOTE — Patient Instructions (Addendum)
[facility] [address] [place], [place] [address] Across the street from [facility] [phone]  [facility] [address]  [place], [place]  [phone]  [facility] [address] [place], [place] [address] [phone]   Test(s) ordered today. Your results will be released to MyChart (or called to you) after review, usually within 72hours after test completion. If any changes need to be made, you will be notified at that same time.  All other Health Maintenance issues reviewed.   All recommended immunizations and age-appropriate screenings are up-to-date or discussed.  No immunizations administered today.   Medications reviewed and updated.  No changes recommended at this time.   Please followup in one year for a physical   Health Maintenance, Female Adopting a healthy lifestyle and getting preventive care can go a long way to promote health and wellness. Talk with your health care provider about what schedule of regular examinations is right for you. This is a good chance for you to check in with your provider about disease prevention and staying healthy. In between checkups, there are plenty of things you can do on your own. Experts have done a lot of research about which lifestyle changes and preventive measures are most likely to keep you healthy. Ask your health care provider for more information. Weight and diet Eat a healthy diet  Be sure to include plenty of vegetables, fruits, low-fat dairy products, and lean protein.  Do not eat a lot of foods high in solid fats, added sugars, or salt.  Get regular exercise. This is one of the most important things you can do for your health.  Most adults should exercise for at least 150 minutes each week. The exercise should increase your heart rate and make you sweat (moderate-intensity  exercise).  Most adults should also do strengthening exercises at least twice a week. This is in addition to the moderate-intensity exercise. Maintain a healthy weight  Body mass index (BMI) is a measurement that can be used to identify possible weight problems. It estimates body fat based on height and weight. Your health care provider can help determine your BMI and help you achieve or maintain a healthy weight.  For females 20 years of age and older:  A BMI below 18.5 is considered underweight.  A BMI of 18.5 to 24.9 is normal.  A BMI of 25 to 29.9 is considered overweight.  A BMI of 30 and above is considered obese. Watch levels of cholesterol and blood lipids  You should start having your blood tested for lipids and cholesterol at 40 years of age, then have this test every 5 years.  You may need to have your cholesterol levels checked more often if:  Your lipid or cholesterol levels are high.  You are older than 40 years of age.  You are at high risk for heart disease. Cancer screening Lung Cancer  Lung cancer screening is recommended for adults 55-80 years old who are at high risk for lung cancer because of a history of smoking.  A yearly low-dose CT scan of the lungs is recommended for people who:  Currently smoke.  Have quit within the past 15 years.  Have at least a 30-pack-year history of smoking. A pack year is smoking an average of one pack of cigarettes a day for 1 year.  Yearly screening should continue until it has been 15 years since you   quit.  Yearly screening should stop if you develop a health problem that would prevent you from having lung cancer treatment. Breast Cancer  Practice breast self-awareness. This means understanding how your breasts normally appear and feel.  It also means doing regular breast self-exams. Let your health care provider know about any changes, no matter how small.  If you are in your 20s or 30s, you should have a clinical  breast exam (CBE) by a health care provider every 1-3 years as part of a regular health exam.  If you are 40 or older, have a CBE every year. Also consider having a breast X-ray (mammogram) every year.  If you have a family history of breast cancer, talk to your health care provider about genetic screening.  If you are at high risk for breast cancer, talk to your health care provider about having an MRI and a mammogram every year.  Breast cancer gene (BRCA) assessment is recommended for women who have family members with BRCA-related cancers. BRCA-related cancers include:  Breast.  Ovarian.  Tubal.  Peritoneal cancers.  Results of the assessment will determine the need for genetic counseling and BRCA1 and BRCA2 testing. Cervical Cancer  Your health care provider may recommend that you be screened regularly for cancer of the pelvic organs (ovaries, uterus, and vagina). This screening involves a pelvic examination, including checking for microscopic changes to the surface of your cervix (Pap test). You may be encouraged to have this screening done every 3 years, beginning at age 21.  For women ages 30-65, health care providers may recommend pelvic exams and Pap testing every 3 years, or they may recommend the Pap and pelvic exam, combined with testing for human papilloma virus (HPV), every 5 years. Some types of HPV increase your risk of cervical cancer. Testing for HPV may also be done on women of any age with unclear Pap test results.  Other health care providers may not recommend any screening for nonpregnant women who are considered low risk for pelvic cancer and who do not have symptoms. Ask your health care provider if a screening pelvic exam is right for you.  If you have had past treatment for cervical cancer or a condition that could lead to cancer, you need Pap tests and screening for cancer for at least 20 years after your treatment. If Pap tests have been discontinued, your risk  factors (such as having a new sexual partner) need to be reassessed to determine if screening should resume. Some women have medical problems that increase the chance of getting cervical cancer. In these cases, your health care provider may recommend more frequent screening and Pap tests. Colorectal Cancer  This type of cancer can be detected and often prevented.  Routine colorectal cancer screening usually begins at 40 years of age and continues through 40 years of age.  Your health care provider may recommend screening at an earlier age if you have risk factors for colon cancer.  Your health care provider may also recommend using home test kits to check for hidden blood in the stool.  A small camera at the end of a tube can be used to examine your colon directly (sigmoidoscopy or colonoscopy). This is done to check for the earliest forms of colorectal cancer.  Routine screening usually begins at age 50.  Direct examination of the colon should be repeated every 5-10 years through 40 years of age. However, you may need to be screened more often if early forms of precancerous   polyps or small growths are found. Skin Cancer  Check your skin from head to toe regularly.  Tell your health care provider about any new moles or changes in moles, especially if there is a change in a mole's shape or color.  Also tell your health care provider if you have a mole that is larger than the size of a pencil eraser.  Always use sunscreen. Apply sunscreen liberally and repeatedly throughout the day.  Protect yourself by wearing long sleeves, pants, a wide-brimmed hat, and sunglasses whenever you are outside. Heart disease, diabetes, and high blood pressure  High blood pressure causes heart disease and increases the risk of stroke. High blood pressure is more likely to develop in:  People who have blood pressure in the high end of the normal range (130-139/85-89 mm Hg).  People who are overweight or  obese.  People who are African American.  If you are 18-39 years of age, have your blood pressure checked every 3-5 years. If you are 40 years of age or older, have your blood pressure checked every year. You should have your blood pressure measured twice-once when you are at a hospital or clinic, and once when you are not at a hospital or clinic. Record the average of the two measurements. To check your blood pressure when you are not at a hospital or clinic, you can use:  An automated blood pressure machine at a pharmacy.  A home blood pressure monitor.  If you are between 55 years and 79 years old, ask your health care provider if you should take aspirin to prevent strokes.  Have regular diabetes screenings. This involves taking a blood sample to check your fasting blood sugar level.  If you are at a normal weight and have a low risk for diabetes, have this test once every three years after 40 years of age.  If you are overweight and have a high risk for diabetes, consider being tested at a younger age or more often. Preventing infection Hepatitis B  If you have a higher risk for hepatitis B, you should be screened for this virus. You are considered at high risk for hepatitis B if:  You were born in a country where hepatitis B is common. Ask your health care provider which countries are considered high risk.  Your parents were born in a high-risk country, and you have not been immunized against hepatitis B (hepatitis B vaccine).  You have HIV or AIDS.  You use needles to inject street drugs.  You live with someone who has hepatitis B.  You have had sex with someone who has hepatitis B.  You get hemodialysis treatment.  You take certain medicines for conditions, including cancer, organ transplantation, and autoimmune conditions. Hepatitis C  Blood testing is recommended for:  Everyone born from 1945 through 1965.  Anyone with known risk factors for hepatitis C. Sexually  transmitted infections (STIs)  You should be screened for sexually transmitted infections (STIs) including gonorrhea and chlamydia if:  You are sexually active and are younger than 40 years of age.  You are older than 40 years of age and your health care provider tells you that you are at risk for this type of infection.  Your sexual activity has changed since you were last screened and you are at an increased risk for chlamydia or gonorrhea. Ask your health care provider if you are at risk.  If you do not have HIV, but are at risk, it may be   recommended that you take a prescription medicine daily to prevent HIV infection. This is called pre-exposure prophylaxis (PrEP). You are considered at risk if:  You are sexually active and do not regularly use condoms or know the HIV status of your partner(s).  You take drugs by injection.  You are sexually active with a partner who has HIV. Talk with your health care provider about whether you are at high risk of being infected with HIV. If you choose to begin PrEP, you should first be tested for HIV. You should then be tested every 3 months for as long as you are taking PrEP. Pregnancy  If you are premenopausal and you may become pregnant, ask your health care provider about preconception counseling.  If you may become pregnant, take 400 to 800 micrograms (mcg) of folic acid every day.  If you want to prevent pregnancy, talk to your health care provider about birth control (contraception). Osteoporosis and menopause  Osteoporosis is a disease in which the bones lose minerals and strength with aging. This can result in serious bone fractures. Your risk for osteoporosis can be identified using a bone density scan.  If you are 65 years of age or older, or if you are at risk for osteoporosis and fractures, ask your health care provider if you should be screened.  Ask your health care provider whether you should take a calcium or vitamin D  supplement to lower your risk for osteoporosis.  Menopause may have certain physical symptoms and risks.  Hormone replacement therapy may reduce some of these symptoms and risks. Talk to your health care provider about whether hormone replacement therapy is right for you. Follow these instructions at home:  Schedule regular health, dental, and eye exams.  Stay current with your immunizations.  Do not use any tobacco products including cigarettes, chewing tobacco, or electronic cigarettes.  If you are pregnant, do not drink alcohol.  If you are breastfeeding, limit how much and how often you drink alcohol.  Limit alcohol intake to no more than 1 drink per day for nonpregnant women. One drink equals 12 ounces of beer, 5 ounces of wine, or 1 ounces of hard liquor.  Do not use street drugs.  Do not share needles.  Ask your health care provider for help if you need support or information about quitting drugs.  Tell your health care provider if you often feel depressed.  Tell your health care provider if you have ever been abused or do not feel safe at home. This information is not intended to replace advice given to you by your health care provider. Make sure you discuss any questions you have with your health care provider. Document Released: 12/31/2010 Document Revised: 11/23/2015 Document Reviewed: 03/21/2015 Elsevier Interactive Patient Education  2017 Elsevier Inc.  

## 2016-09-13 ENCOUNTER — Encounter: Payer: Self-pay | Admitting: Internal Medicine

## 2016-11-19 DIAGNOSIS — Z01419 Encounter for gynecological examination (general) (routine) without abnormal findings: Secondary | ICD-10-CM | POA: Diagnosis not present

## 2016-11-19 DIAGNOSIS — Z319 Encounter for procreative management, unspecified: Secondary | ICD-10-CM | POA: Diagnosis not present

## 2016-11-19 DIAGNOSIS — Z1151 Encounter for screening for human papillomavirus (HPV): Secondary | ICD-10-CM | POA: Diagnosis not present

## 2016-11-19 DIAGNOSIS — Z681 Body mass index (BMI) 19 or less, adult: Secondary | ICD-10-CM | POA: Diagnosis not present

## 2016-11-19 DIAGNOSIS — Z1231 Encounter for screening mammogram for malignant neoplasm of breast: Secondary | ICD-10-CM | POA: Diagnosis not present

## 2016-11-19 DIAGNOSIS — Z124 Encounter for screening for malignant neoplasm of cervix: Secondary | ICD-10-CM | POA: Diagnosis not present

## 2016-11-19 LAB — HM PAP SMEAR: HM PAP: NORMAL

## 2016-11-19 LAB — HM MAMMOGRAPHY: HM Mammogram: NORMAL (ref 0–4)

## 2016-12-05 ENCOUNTER — Encounter: Payer: Self-pay | Admitting: Internal Medicine

## 2017-01-08 DIAGNOSIS — Z3201 Encounter for pregnancy test, result positive: Secondary | ICD-10-CM | POA: Diagnosis not present

## 2017-01-08 DIAGNOSIS — N911 Secondary amenorrhea: Secondary | ICD-10-CM | POA: Diagnosis not present

## 2017-01-08 NOTE — Progress Notes (Signed)
Patient never returned our call with previous GI doctors name. Sent the ROI to be scanned into EPIC in case patient calls back with that information.

## 2017-01-17 DIAGNOSIS — Z331 Pregnant state, incidental: Secondary | ICD-10-CM | POA: Diagnosis not present

## 2017-01-17 DIAGNOSIS — Z7689 Persons encountering health services in other specified circumstances: Secondary | ICD-10-CM | POA: Diagnosis not present

## 2017-01-17 DIAGNOSIS — Z3A09 9 weeks gestation of pregnancy: Secondary | ICD-10-CM | POA: Diagnosis not present

## 2017-01-17 DIAGNOSIS — O3680X0 Pregnancy with inconclusive fetal viability, not applicable or unspecified: Secondary | ICD-10-CM | POA: Diagnosis not present

## 2017-01-21 DIAGNOSIS — Z331 Pregnant state, incidental: Secondary | ICD-10-CM | POA: Diagnosis not present

## 2017-01-21 DIAGNOSIS — O021 Missed abortion: Secondary | ICD-10-CM | POA: Diagnosis not present

## 2017-01-28 DIAGNOSIS — O021 Missed abortion: Secondary | ICD-10-CM | POA: Diagnosis not present

## 2017-02-11 DIAGNOSIS — O021 Missed abortion: Secondary | ICD-10-CM | POA: Diagnosis not present

## 2017-02-21 DIAGNOSIS — O039 Complete or unspecified spontaneous abortion without complication: Secondary | ICD-10-CM | POA: Diagnosis not present

## 2017-02-24 DIAGNOSIS — H5213 Myopia, bilateral: Secondary | ICD-10-CM | POA: Diagnosis not present

## 2017-05-07 IMAGING — US US ABDOMEN COMPLETE
1 series · 14 of 25 positions shown · non-contrast
Comparison: None.

CLINICAL DATA: Abdominal pain, symptoms of reflux

EXAM:
ULTRASOUND ABDOMEN COMPLETE

[Series 1: us abdomen complete · 0.14mm/px · 14 of 128 slices shown]
[im 1/128]
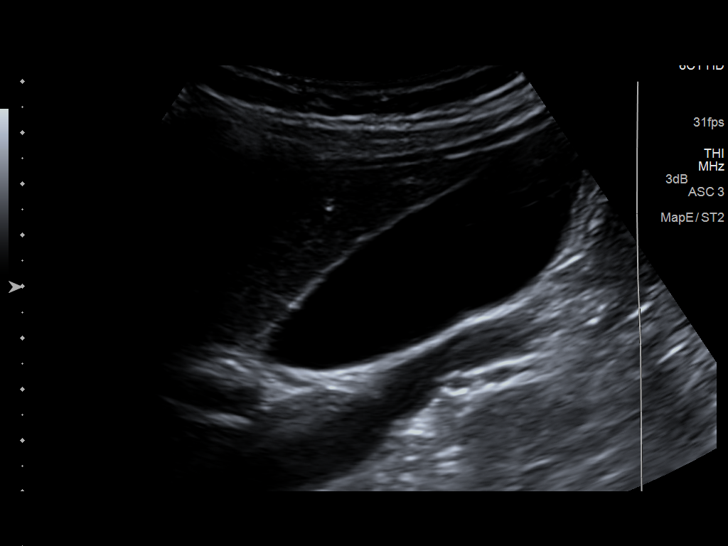
[im 11/128]
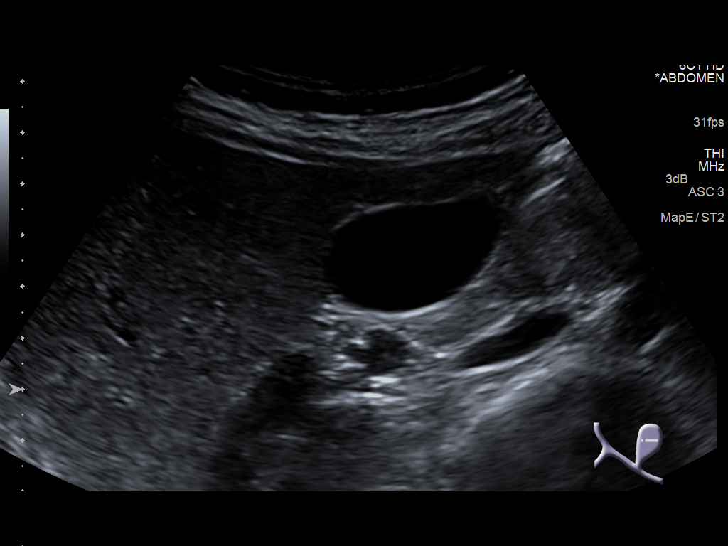
[im 22/128]
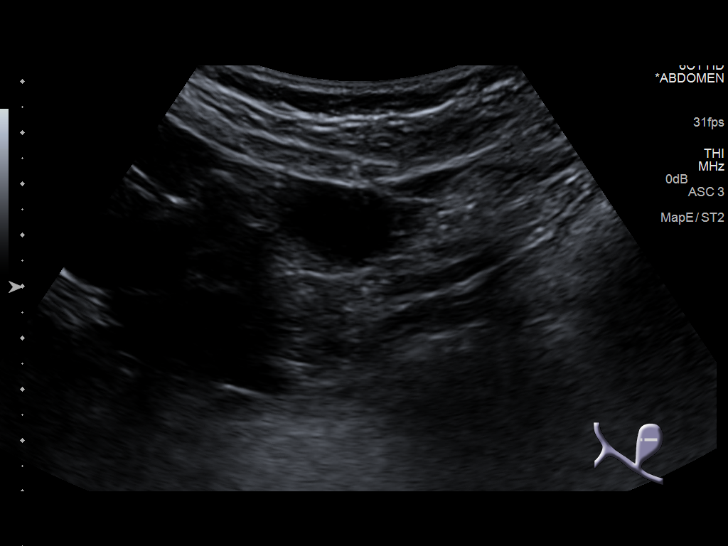
[im 32/128]
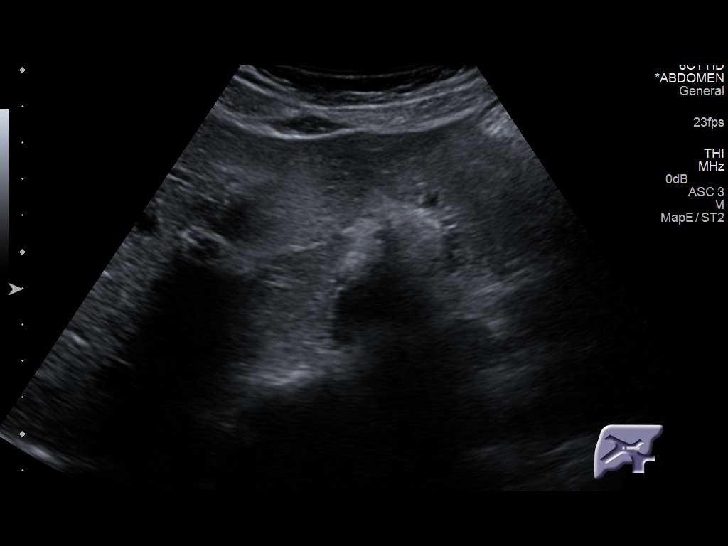
[im 43/128]
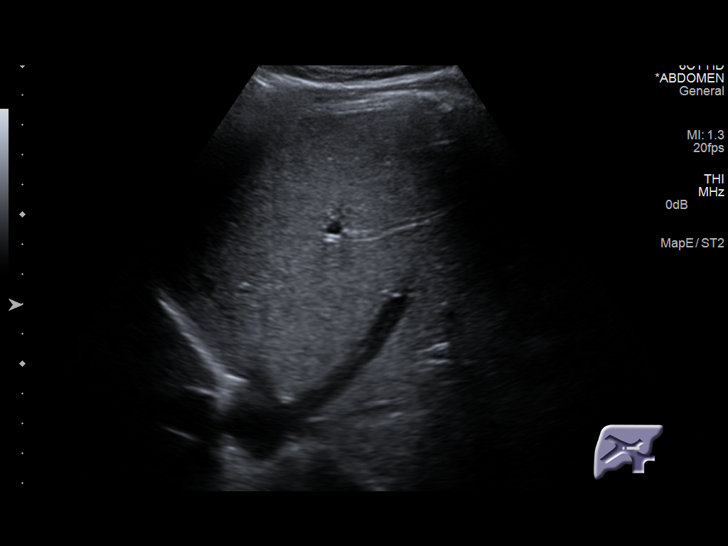
[im 48/128]
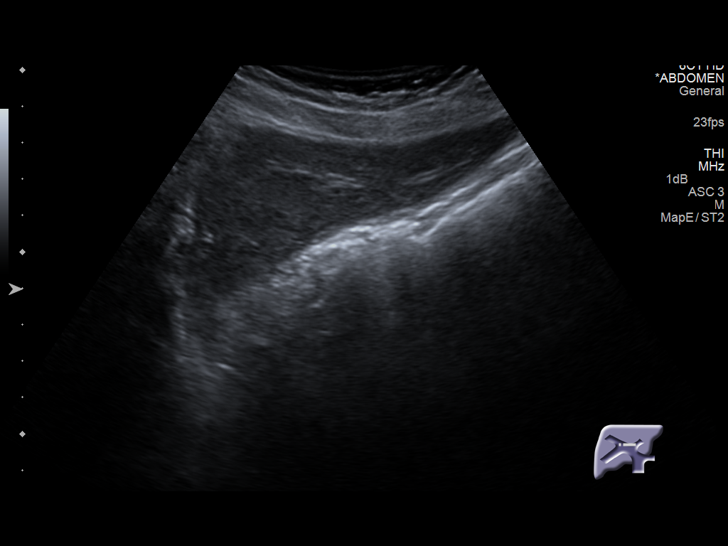
[im 59/128]
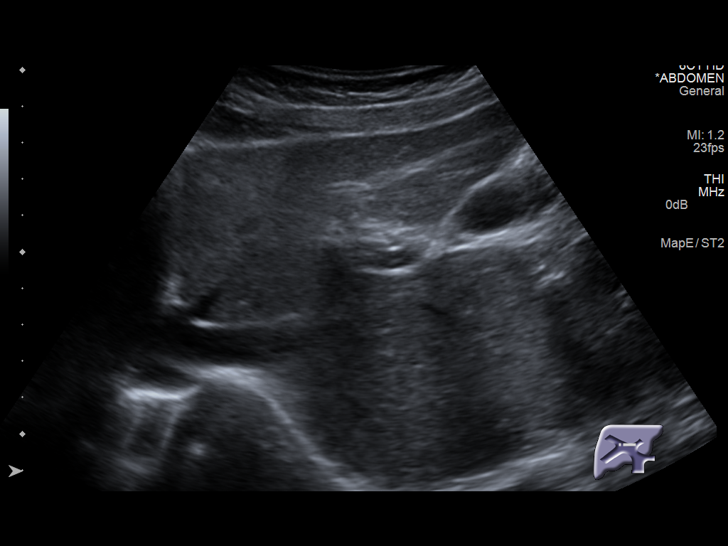
[im 69/128]
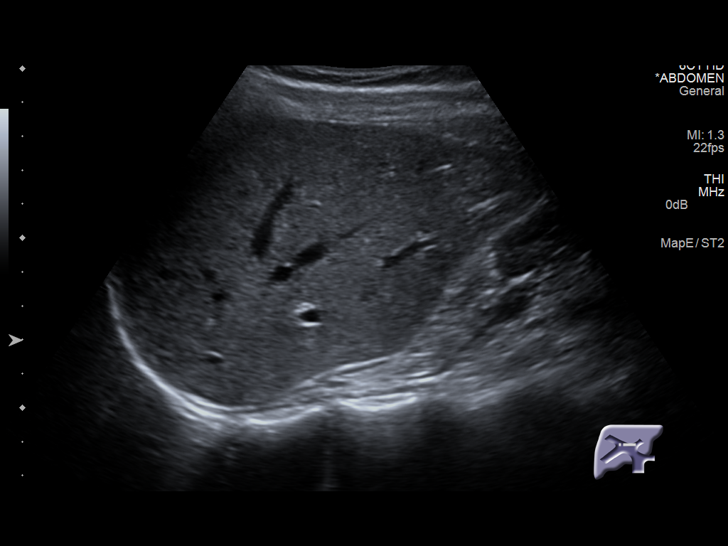
[im 80/128]
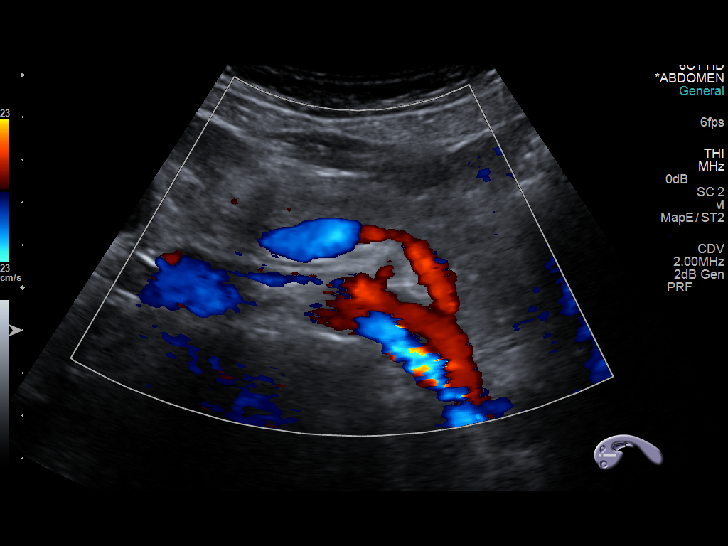
[im 85/128]
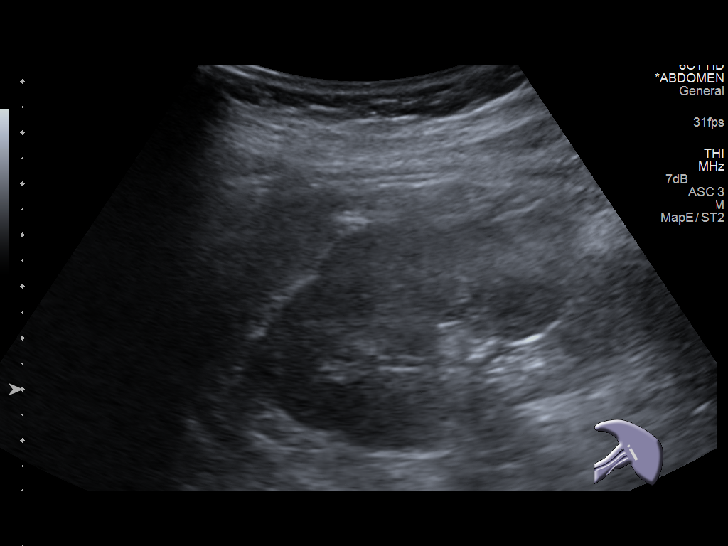
[im 96/128]
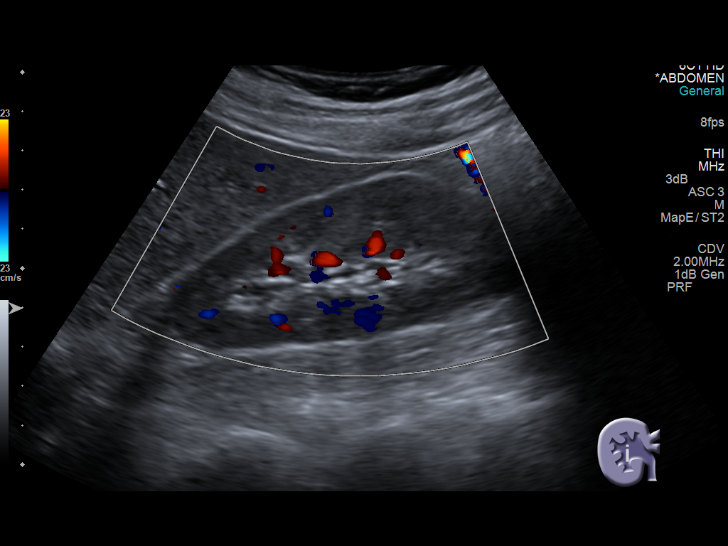
[im 106/128]
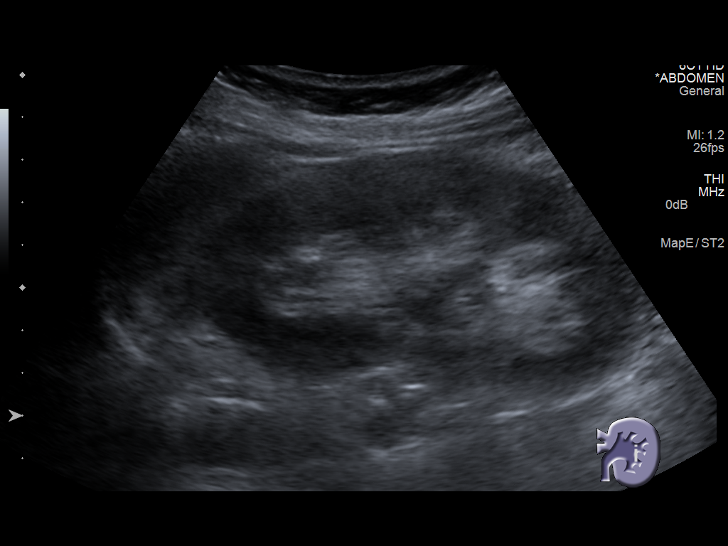
[im 117/128]
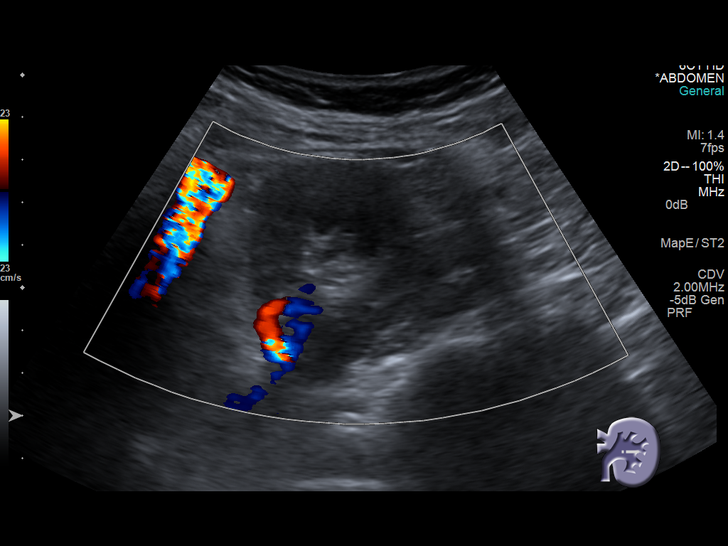
[im 128/128]
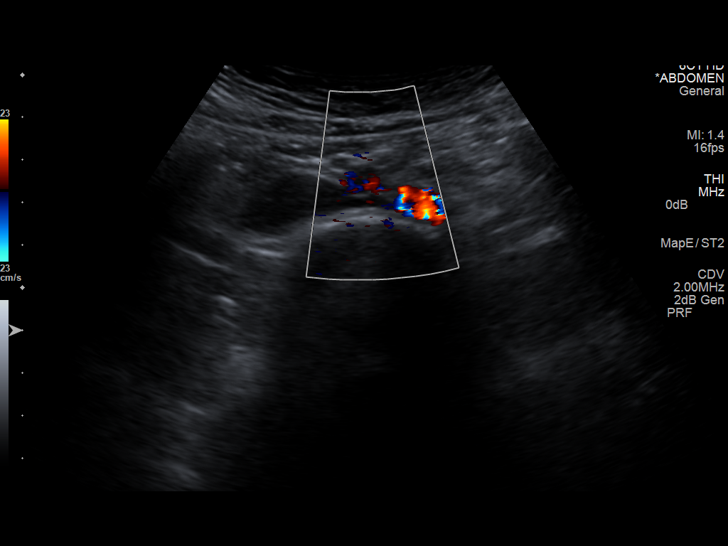

[14 of 25 positions shown; findings below may reference images not displayed]

FINDINGS: Gallbladder: The gallbladder is visualized and no gallstones are
noted. There is no pain over the gallbladder with compression.

Common bile duct: Diameter: The common bile duct is normal measuring
4.2 mm in diameter.

Liver: The liver has a normal echogenic pattern. No focal hepatic
abnormality is seen.

IVC: No abnormality visualized.

Pancreas: The pancreas is moderately well visualized with only the
most distal tail not well seen due to bowel gas.

Spleen: The spleen is normal measuring 3.7 cm.

Right Kidney: Length: 9.9 cm..  No hydronephrosis is seen.

Left Kidney: Length: 10.8 cm..  No hydronephrosis is noted.

Abdominal aorta: The abdominal aorta is normal in caliber.

Other findings: None.
IMPRESSION: Negative abdominal ultrasound.

## 2017-07-15 DIAGNOSIS — N911 Secondary amenorrhea: Secondary | ICD-10-CM | POA: Diagnosis not present

## 2017-07-15 DIAGNOSIS — Z3201 Encounter for pregnancy test, result positive: Secondary | ICD-10-CM | POA: Diagnosis not present

## 2017-07-24 ENCOUNTER — Encounter: Payer: Self-pay | Admitting: Podiatry

## 2017-07-24 ENCOUNTER — Ambulatory Visit: Payer: 59

## 2017-07-24 ENCOUNTER — Ambulatory Visit (INDEPENDENT_AMBULATORY_CARE_PROVIDER_SITE_OTHER): Payer: 59 | Admitting: Podiatry

## 2017-07-24 VITALS — BP 100/65 | HR 79 | Resp 16

## 2017-07-24 DIAGNOSIS — M84375A Stress fracture, left foot, initial encounter for fracture: Secondary | ICD-10-CM | POA: Diagnosis not present

## 2017-07-24 DIAGNOSIS — M779 Enthesopathy, unspecified: Secondary | ICD-10-CM | POA: Diagnosis not present

## 2017-07-24 DIAGNOSIS — M79672 Pain in left foot: Secondary | ICD-10-CM

## 2017-07-24 NOTE — Progress Notes (Signed)
Subjective:   Patient ID: Jessica Warner, female   DOB: 42 y.o.   MRN: [id number]   HPI Patient presents stating that she has a lot of pain in her left forefoot and that she had had this in her right one last year.  She is pregnant and is due in September.  Patient does not smoke and likes to be active   Review of Systems  All other systems reviewed and are negative.       Objective:  Physical Exam  Constitutional: She appears well-developed and well-nourished.  Cardiovascular: Intact distal pulses.  Pulmonary/Chest: Effort normal.  Musculoskeletal: Normal range of motion.  Neurological: She is alert.  Skin: Skin is warm.  Nursing note and vitals reviewed.   Neurovascular status intact muscle strength adequate range of motion within normal limits with patient found to have redness and inflammation around the third metatarsal phalangeal joint left is very painful when pressed with slight proximal pain but more centered on the joint itself.  She had cannot bear weight and is currently using a rollabout in order to get around.  Patient has good digital perfusion well oriented x3     Assessment:  Probability that this is an acute local inflammatory condition but due to the fact she also had it in the other foot last year I cannot rule out that there is not a systemic inflammatory condition occurring.     Plan:  H&P condition reviewed and discussed in the fact we have to be very conservative with this.  At this point I am placing her in an air fracture walker to immobilize the plantar foot and she will continue with rollabout until it feels better but it should start to feel quite a bit better in the next few days.  I then advised on soaks will reevaluate 3 weeks and may require blood work.  May have to get more aggressive with this after she has the baby if symptoms continue to persist

## 2017-07-24 NOTE — Progress Notes (Signed)
   Subjective:    Patient ID: Jessica Warner, female    DOB: 1976/03/04, 42 y.o.   MRN: [id number]  HPI    Review of Systems  All other systems reviewed and are negative.      Objective:   Physical Exam        Assessment & Plan:

## 2017-08-05 DIAGNOSIS — Z3689 Encounter for other specified antenatal screening: Secondary | ICD-10-CM | POA: Diagnosis not present

## 2017-08-05 DIAGNOSIS — Z368A Encounter for antenatal screening for other genetic defects: Secondary | ICD-10-CM | POA: Diagnosis not present

## 2017-08-05 DIAGNOSIS — O09519 Supervision of elderly primigravida, unspecified trimester: Secondary | ICD-10-CM | POA: Diagnosis not present

## 2017-08-05 DIAGNOSIS — Z3A10 10 weeks gestation of pregnancy: Secondary | ICD-10-CM | POA: Diagnosis not present

## 2017-08-05 DIAGNOSIS — Z113 Encounter for screening for infections with a predominantly sexual mode of transmission: Secondary | ICD-10-CM | POA: Diagnosis not present

## 2017-08-05 DIAGNOSIS — O26891 Other specified pregnancy related conditions, first trimester: Secondary | ICD-10-CM | POA: Diagnosis not present

## 2017-08-05 DIAGNOSIS — O09521 Supervision of elderly multigravida, first trimester: Secondary | ICD-10-CM | POA: Diagnosis not present

## 2017-08-05 LAB — OB RESULTS CONSOLE RUBELLA ANTIBODY, IGM: Rubella: IMMUNE

## 2017-08-05 LAB — OB RESULTS CONSOLE ANTIBODY SCREEN: Antibody Screen: NEGATIVE

## 2017-08-05 LAB — OB RESULTS CONSOLE HEPATITIS B SURFACE ANTIGEN: HEP B S AG: NEGATIVE

## 2017-08-05 LAB — OB RESULTS CONSOLE HIV ANTIBODY (ROUTINE TESTING): HIV: NONREACTIVE

## 2017-08-05 LAB — OB RESULTS CONSOLE ABO/RH: RH Type: POSITIVE

## 2017-08-05 LAB — OB RESULTS CONSOLE RPR: RPR: NONREACTIVE

## 2017-08-05 LAB — OB RESULTS CONSOLE GC/CHLAMYDIA
Chlamydia: NEGATIVE
Gonorrhea: NEGATIVE

## 2017-08-18 DIAGNOSIS — Z3682 Encounter for antenatal screening for nuchal translucency: Secondary | ICD-10-CM | POA: Diagnosis not present

## 2017-08-18 DIAGNOSIS — O34211 Maternal care for low transverse scar from previous cesarean delivery: Secondary | ICD-10-CM | POA: Diagnosis not present

## 2017-08-18 DIAGNOSIS — Z3A12 12 weeks gestation of pregnancy: Secondary | ICD-10-CM | POA: Diagnosis not present

## 2017-08-18 DIAGNOSIS — O09519 Supervision of elderly primigravida, unspecified trimester: Secondary | ICD-10-CM | POA: Diagnosis not present

## 2017-09-03 DIAGNOSIS — Z3689 Encounter for other specified antenatal screening: Secondary | ICD-10-CM | POA: Diagnosis not present

## 2017-10-02 DIAGNOSIS — O34211 Maternal care for low transverse scar from previous cesarean delivery: Secondary | ICD-10-CM | POA: Diagnosis not present

## 2017-10-02 DIAGNOSIS — Z3A18 18 weeks gestation of pregnancy: Secondary | ICD-10-CM | POA: Diagnosis not present

## 2017-10-02 DIAGNOSIS — Z363 Encounter for antenatal screening for malformations: Secondary | ICD-10-CM | POA: Diagnosis not present

## 2017-10-02 DIAGNOSIS — O09512 Supervision of elderly primigravida, second trimester: Secondary | ICD-10-CM | POA: Diagnosis not present

## 2017-12-03 DIAGNOSIS — O09522 Supervision of elderly multigravida, second trimester: Secondary | ICD-10-CM | POA: Diagnosis not present

## 2017-12-03 DIAGNOSIS — Z23 Encounter for immunization: Secondary | ICD-10-CM | POA: Diagnosis not present

## 2017-12-03 DIAGNOSIS — Z3689 Encounter for other specified antenatal screening: Secondary | ICD-10-CM | POA: Diagnosis not present

## 2017-12-03 DIAGNOSIS — O34219 Maternal care for unspecified type scar from previous cesarean delivery: Secondary | ICD-10-CM | POA: Diagnosis not present

## 2017-12-03 DIAGNOSIS — Z3A27 27 weeks gestation of pregnancy: Secondary | ICD-10-CM | POA: Diagnosis not present

## 2017-12-09 DIAGNOSIS — O99810 Abnormal glucose complicating pregnancy: Secondary | ICD-10-CM | POA: Diagnosis not present

## 2018-01-20 ENCOUNTER — Encounter: Payer: Self-pay | Admitting: Internal Medicine

## 2018-01-28 DIAGNOSIS — O09523 Supervision of elderly multigravida, third trimester: Secondary | ICD-10-CM | POA: Diagnosis not present

## 2018-01-28 DIAGNOSIS — Z3685 Encounter for antenatal screening for Streptococcus B: Secondary | ICD-10-CM | POA: Diagnosis not present

## 2018-01-28 DIAGNOSIS — O3429 Maternal care due to uterine scar from other previous surgery: Secondary | ICD-10-CM | POA: Diagnosis not present

## 2018-01-28 DIAGNOSIS — Z3A35 35 weeks gestation of pregnancy: Secondary | ICD-10-CM | POA: Diagnosis not present

## 2018-02-06 ENCOUNTER — Encounter (HOSPITAL_COMMUNITY): Payer: Self-pay | Admitting: *Deleted

## 2018-02-20 ENCOUNTER — Encounter (HOSPITAL_COMMUNITY)
Admission: RE | Admit: 2018-02-20 | Discharge: 2018-02-20 | Disposition: A | Discharge status: Home / Self Care | Payer: 59 | Source: Ambulatory Visit | Attending: Obstetrics and Gynecology | Admitting: Obstetrics and Gynecology

## 2018-02-20 DIAGNOSIS — O99824 Streptococcus B carrier state complicating childbirth: Secondary | ICD-10-CM | POA: Diagnosis not present

## 2018-02-20 DIAGNOSIS — Z3A39 39 weeks gestation of pregnancy: Secondary | ICD-10-CM | POA: Diagnosis not present

## 2018-02-20 DIAGNOSIS — O34211 Maternal care for low transverse scar from previous cesarean delivery: Secondary | ICD-10-CM | POA: Diagnosis not present

## 2018-02-20 LAB — CBC
HEMATOCRIT: 38.8 % (ref 36.0–46.0)
Hemoglobin: 13.1 g/dL (ref 12.0–15.0)
MCH: 33 pg (ref 26.0–34.0)
MCHC: 33.8 g/dL (ref 30.0–36.0)
MCV: 97.7 fL (ref 78.0–100.0)
Platelets: 234 10*3/uL (ref 150–400)
RBC: 3.97 MIL/uL (ref 3.87–5.11)
RDW: 13.9 % (ref 11.5–15.5)
WBC: 7.7 10*3/uL (ref 4.0–10.5)

## 2018-02-20 LAB — ABO/RH: ABO/RH(D): O POS

## 2018-02-20 LAB — TYPE AND SCREEN
ABO/RH(D): O POS
ANTIBODY SCREEN: NEGATIVE

## 2018-02-20 NOTE — Patient Instructions (Signed)
Jessica Warner  02/20/2018   Your procedure is scheduled on:  02/23/2018  Enter through the Main Entrance of [facility] [place] up the phone at the desk and dial [phone]  Call this number if you have problems the morning of surgery:[phone]  Remember:   Do not eat food:(After Midnight) Desps de medianoche.  Do not drink clear liquids: (After Midnight) Desps de medianoche.  Take these medicines the morning of surgery with A SIP OF WATER: none   Do not wear jewelry, make-up or nail polish.  Do not wear lotions, powders, or perfumes. Do not wear deodorant.  Do not shave 48 hours prior to surgery.  Do not bring valuables to the hospital.  [facility] is not   responsible for any belongings or valuables brought to the hospital.  Contacts, dentures or bridgework may not be worn into surgery.  Leave suitcase in the car. After surgery it may be brought to your room.  For patients admitted to the hospital, checkout time is 11:00 AM the day of              discharge.    N/A   Please read over the following fact sheets that you were given:   Surgical Site Infection Prevention

## 2018-02-21 LAB — RPR: RPR Ser Ql: NONREACTIVE

## 2018-02-22 ENCOUNTER — Encounter (HOSPITAL_COMMUNITY): Payer: Self-pay | Admitting: Obstetrics and Gynecology

## 2018-02-22 DIAGNOSIS — Z98891 History of uterine scar from previous surgery: Secondary | ICD-10-CM

## 2018-02-22 HISTORY — DX: History of uterine scar from previous surgery: Z98.891

## 2018-02-22 NOTE — Anesthesia Preprocedure Evaluation (Addendum)
Anesthesia Evaluation  Patient identified by MRN, date of birth, ID band Patient awake    Reviewed: Allergy & Precautions, NPO status , Patient's Chart, lab work & pertinent test results  Airway Mallampati: II  TM Distance: >3 FB Neck ROM: Full    Dental no notable dental hx. (+) Teeth Intact, Dental Advisory Given   Pulmonary neg pulmonary ROS,    Pulmonary exam normal breath sounds clear to auscultation       Cardiovascular Exercise Tolerance: Good negative cardio ROS Normal cardiovascular exam Rhythm:Regular Rate:Normal     Neuro/Psych negative neurological ROS  negative psych ROS   GI/Hepatic Neg liver ROS,   Endo/Other    Renal/GU negative Renal ROS     Musculoskeletal   Abdominal   Peds  Hematology   Anesthesia Other Findings Hx of Prior c/s and AMA  Reproductive/Obstetrics (+) Pregnancy                            Lab Results  Component Value Date   WBC 7.7 02/20/2018   HGB 13.1 02/20/2018   HCT 38.8 02/20/2018   MCV 97.7 02/20/2018   PLT 234 02/20/2018   Anesthesia Physical Anesthesia Plan  ASA: II  Anesthesia Plan: Spinal   Post-op Pain Management:    Induction:   PONV Risk Score and Plan:   Airway Management Planned: Natural Airway and Nasal Cannula  Additional Equipment:   Intra-op Plan:   Post-operative Plan:   Informed Consent: I have reviewed the patients History and Physical, chart, labs and discussed the procedure including the risks, benefits and alternatives for the proposed anesthesia with the patient or authorized representative who has indicated his/her understanding and acceptance.   Dental advisory given  Plan Discussed with:   Anesthesia Plan Comments:         Anesthesia Quick Evaluation

## 2018-02-22 NOTE — H&P (Signed)
Laron Angelini is a 42 y.o. female G3P1011 at 81 for rLTCS.  Pregnancy complicated by AMA - normal panorama, also failed glucola with nl 3hr GTT.  Pregnancy dated by LMP c/w early [place].    Relatively uncomplicated Prenatal care.  D/W pt r/b/a of rLTCS - wishes to proceed.    OB History    Gravida  3   Para  1   Term  1   Preterm      AB  1   Living  1     SAB  1   TAB      Ectopic      Multiple      Live Births  1         no abn pap, last 5/18, HPV neg No STD  MMG WNL 5/18  8#11 LTCS, female 2009 SAB G3 present  Past Medical History:  Diagnosis Date  . GERD (gastroesophageal reflux disease)   . History of cesarean delivery 02/22/2018   Past Surgical History:  Procedure Laterality Date  . CESAREAN SECTION    . UPPER GASTROINTESTINAL ENDOSCOPY    . WISDOM TOOTH EXTRACTION     Family History: family history includes Healthy in her father, maternal grandfather, maternal grandmother, mother, paternal grandfather, and paternal grandmother. Social History:  reports that she has never smoked. She has never used smokeless tobacco. She reports that she does not drink alcohol or use drugs.medical physicist, married  Meds PNV All NKDA     Maternal Diabetes: No Genetic Screening: Normal Maternal Ultrasounds/Referrals: Normal Fetal Ultrasounds or other Referrals:  None Maternal Substance Abuse:  No Significant Maternal Medications:  None Significant Maternal Lab Results:  Lab values include: Group B Strep positive Other Comments:  None  Review of Systems  Constitutional: Negative.   HENT: Negative.   Eyes: Negative.   Respiratory: Negative.   Cardiovascular: Negative.   Gastrointestinal: Negative.   Genitourinary: Negative.   Musculoskeletal: Negative.   Skin: Negative.   Neurological: Negative.   Psychiatric/Behavioral: Negative.    Maternal Medical History:  Contractions: Frequency: irregular.    Fetal activity: Perceived fetal activity is normal.     Prenatal Complications - Diabetes: none.      Last menstrual period 05/24/2017. Maternal Exam:  Uterine Assessment: Contraction frequency is irregular.   Abdomen: Patient reports no abdominal tenderness. Surgical scars: low transverse.   Fundal height is appropriate for gestation.   Estimated fetal weight is 8.5-9#.   Fetal presentation: vertex  Introitus: Normal vulva. Normal vagina.    Physical Exam  Constitutional: She is oriented to person, place, and time. She appears well-developed and well-nourished.  HENT:  Head: Normocephalic and atraumatic.  Cardiovascular: Normal rate and regular rhythm.  Respiratory: Effort normal and breath sounds normal. No respiratory distress. She has no wheezes.  GI: Soft. Bowel sounds are normal. She exhibits no distension. There is no tenderness.  Musculoskeletal: Normal range of motion.  Neurological: She is alert and oriented to person, place, and time.  Skin: Skin is warm and dry.  Psychiatric: She has a normal mood and affect. Her behavior is normal.    Prenatal labs: ABO, Rh: --/--/O POS, O POS Performed at [facility], [address], [place], [place] [address]  [phone]/23 1012) Antibody: NEG (08/23 1012) Rubella: Immune (02/05 0000) RPR: Non Reactive (08/23 1012)  HBsAg: Negative (02/05 0000)  HIV: Non-reactive (02/05 0000)  GBS:   positive  Hgb 13.7/Plt 283K/Ur cx neg/Chl neg/GC neg/Varicella imune/Hgb electro WNL/ nl NT, nl  panorama, glucola 167 - nl 3hr GTT/ essential panel - CF, SMA and fragile X neg/ nl anat female, post plac  Tdap in [facility]  Assessment/Plan: 18yo G3P1011 at 39wk for rLTCS D/w pt r/b/a of rLTCS, voices understanding to proceed Hospital circ is paid - havd d/w pt r/b/a [place] [facility] for pediatric care.     Thelma Viana Bovard-Stuckert 02/22/2018, 4:08 PM

## 2018-02-23 ENCOUNTER — Inpatient Hospital Stay (HOSPITAL_COMMUNITY): Admission: RE | Admit: 2018-02-23 | Payer: 59 | Source: Ambulatory Visit

## 2018-02-23 ENCOUNTER — Inpatient Hospital Stay (HOSPITAL_COMMUNITY): Payer: 59 | Admitting: Anesthesiology

## 2018-02-23 ENCOUNTER — Encounter (HOSPITAL_COMMUNITY): Payer: Self-pay | Admitting: *Deleted

## 2018-02-23 ENCOUNTER — Inpatient Hospital Stay (HOSPITAL_COMMUNITY)
Admission: RE | Admit: 2018-02-23 | Discharge: 2018-02-26 | DRG: 788 | Disposition: A | Discharge status: Home / Self Care | Payer: 59 | Attending: Obstetrics and Gynecology | Admitting: Obstetrics and Gynecology

## 2018-02-23 ENCOUNTER — Encounter (HOSPITAL_COMMUNITY)
Admission: RE | Disposition: A | Discharge status: Home / Self Care | Payer: Self-pay | Source: Home / Self Care | Attending: Obstetrics and Gynecology

## 2018-02-23 DIAGNOSIS — O99824 Streptococcus B carrier state complicating childbirth: Secondary | ICD-10-CM | POA: Diagnosis present

## 2018-02-23 DIAGNOSIS — Z3A39 39 weeks gestation of pregnancy: Secondary | ICD-10-CM

## 2018-02-23 DIAGNOSIS — O34211 Maternal care for low transverse scar from previous cesarean delivery: Secondary | ICD-10-CM | POA: Diagnosis not present

## 2018-02-23 DIAGNOSIS — Z3A00 Weeks of gestation of pregnancy not specified: Secondary | ICD-10-CM | POA: Diagnosis not present

## 2018-02-23 DIAGNOSIS — Z98891 History of uterine scar from previous surgery: Secondary | ICD-10-CM

## 2018-02-23 HISTORY — DX: History of uterine scar from previous surgery: Z98.891

## 2018-02-23 LAB — CBC
HEMATOCRIT: 31.5 % — AB (ref 36.0–46.0)
Hemoglobin: 10.8 g/dL — ABNORMAL LOW (ref 12.0–15.0)
MCH: 33.3 pg (ref 26.0–34.0)
MCHC: 34.3 g/dL (ref 30.0–36.0)
MCV: 97.2 fL (ref 78.0–100.0)
Platelets: 164 10*3/uL (ref 150–400)
RBC: 3.24 MIL/uL — AB (ref 3.87–5.11)
RDW: 13.8 % (ref 11.5–15.5)
WBC: 9.9 10*3/uL (ref 4.0–10.5)

## 2018-02-23 SURGERY — Surgical Case
Anesthesia: Spinal

## 2018-02-23 MED ORDER — CEFAZOLIN SODIUM-DEXTROSE 2-3 GM-%(50ML) IV SOLR
INTRAVENOUS | Status: DC | PRN
  Administered 2018-02-23: 2 g via INTRAVENOUS

## 2018-02-23 MED ORDER — SCOPOLAMINE 1 MG/3DAYS TD PT72
1.0000 | MEDICATED_PATCH | Freq: Once | TRANSDERMAL | Status: AC
  Administered 2018-02-23: 1.5 mg via TRANSDERMAL
  Filled 2018-02-23: qty 1

## 2018-02-23 MED ORDER — PRENATAL MULTIVITAMIN CH
1.0000 | ORAL_TABLET | Freq: Every day | ORAL | Status: DC
  Administered 2018-02-24 – 2018-02-26 (×3): 1 via ORAL
  Filled 2018-02-23 (×3): qty 1

## 2018-02-23 MED ORDER — OXYTOCIN 10 UNIT/ML IJ SOLN
INTRAMUSCULAR | Status: AC
  Filled 2018-02-23: qty 4

## 2018-02-23 MED ORDER — BUPIVACAINE IN DEXTROSE 0.75-8.25 % IT SOLN
INTRATHECAL | Status: DC | PRN
  Administered 2018-02-23: 12 mg via INTRATHECAL

## 2018-02-23 MED ORDER — ACETAMINOPHEN 10 MG/ML IV SOLN: 1000.0000 mg | Freq: Once | INTRAVENOUS | Status: DC | PRN

## 2018-02-23 MED ORDER — FENTANYL CITRATE (PF) 100 MCG/2ML IJ SOLN
INTRAMUSCULAR | Status: AC
  Filled 2018-02-23: qty 2

## 2018-02-23 MED ORDER — DIBUCAINE 1 % RE OINT: 1.0000 "application " | TOPICAL_OINTMENT | RECTAL | Status: DC | PRN

## 2018-02-23 MED ORDER — NALOXONE HCL 4 MG/10ML IJ SOLN
1.0000 ug/kg/h | INTRAVENOUS | Status: DC | PRN
  Filled 2018-02-23: qty 5

## 2018-02-23 MED ORDER — COCONUT OIL OIL
1.0000 "application " | TOPICAL_OIL | Status: DC | PRN
  Filled 2018-02-23: qty 120

## 2018-02-23 MED ORDER — SODIUM CHLORIDE 0.9 % IR SOLN
Status: DC | PRN
  Administered 2018-02-23: 1000 mL

## 2018-02-23 MED ORDER — SIMETHICONE 80 MG PO CHEW
80.0000 mg | CHEWABLE_TABLET | ORAL | Status: DC
  Administered 2018-02-23 – 2018-02-25 (×3): 80 mg via ORAL
  Filled 2018-02-23 (×3): qty 1

## 2018-02-23 MED ORDER — PHENYLEPHRINE 40 MCG/ML (10ML) SYRINGE FOR IV PUSH (FOR BLOOD PRESSURE SUPPORT)
PREFILLED_SYRINGE | INTRAVENOUS | Status: AC
  Filled 2018-02-23: qty 10

## 2018-02-23 MED ORDER — MORPHINE SULFATE (PF) 0.5 MG/ML IJ SOLN
INTRAMUSCULAR | Status: AC
  Filled 2018-02-23: qty 10

## 2018-02-23 MED ORDER — PROMETHAZINE HCL 25 MG/ML IJ SOLN
6.2500 mg | INTRAMUSCULAR | Status: DC | PRN
Start: 2018-02-23 — End: 2018-02-23

## 2018-02-23 MED ORDER — HYDROCODONE-ACETAMINOPHEN 7.5-325 MG PO TABS: 1.0000 | ORAL_TABLET | Freq: Once | ORAL | Status: DC | PRN

## 2018-02-23 MED ORDER — PHENYLEPHRINE 40 MCG/ML (10ML) SYRINGE FOR IV PUSH (FOR BLOOD PRESSURE SUPPORT)
PREFILLED_SYRINGE | INTRAVENOUS | Status: DC | PRN
  Administered 2018-02-23 (×3): 80 ug via INTRAVENOUS

## 2018-02-23 MED ORDER — KETOROLAC TROMETHAMINE 30 MG/ML IJ SOLN
30.0000 mg | Freq: Four times a day (QID) | INTRAMUSCULAR | Status: AC | PRN
  Administered 2018-02-23: 30 mg via INTRAMUSCULAR

## 2018-02-23 MED ORDER — NALBUPHINE HCL 10 MG/ML IJ SOLN: 5.0000 mg | Freq: Once | INTRAMUSCULAR | Status: DC | PRN

## 2018-02-23 MED ORDER — MENTHOL 3 MG MT LOZG: 1.0000 | LOZENGE | OROMUCOSAL | Status: DC | PRN

## 2018-02-23 MED ORDER — KETOROLAC TROMETHAMINE 30 MG/ML IJ SOLN
INTRAMUSCULAR | Status: AC
  Filled 2018-02-23: qty 1

## 2018-02-23 MED ORDER — PHENYLEPHRINE 8 MG IN D5W 100 ML (0.08MG/ML) PREMIX OPTIME
INJECTION | INTRAVENOUS | Status: DC | PRN
  Administered 2018-02-23: 60 ug/min via INTRAVENOUS

## 2018-02-23 MED ORDER — ONDANSETRON HCL 4 MG/2ML IJ SOLN
INTRAMUSCULAR | Status: AC
  Filled 2018-02-23: qty 2

## 2018-02-23 MED ORDER — MORPHINE SULFATE (PF) 0.5 MG/ML IJ SOLN
INTRAMUSCULAR | Status: DC | PRN
  Administered 2018-02-23: .15 mg via INTRATHECAL

## 2018-02-23 MED ORDER — ONDANSETRON HCL 4 MG/2ML IJ SOLN
INTRAMUSCULAR | Status: DC | PRN
  Administered 2018-02-23: 4 mg via INTRAVENOUS

## 2018-02-23 MED ORDER — LACTATED RINGERS IV SOLN
INTRAVENOUS | Status: DC
  Administered 2018-02-23 – 2018-02-24 (×2): via INTRAVENOUS

## 2018-02-23 MED ORDER — WITCH HAZEL-GLYCERIN EX PADS: 1.0000 "application " | MEDICATED_PAD | CUTANEOUS | Status: DC | PRN

## 2018-02-23 MED ORDER — OXYCODONE HCL 5 MG PO TABS: 10.0000 mg | ORAL_TABLET | ORAL | Status: DC | PRN

## 2018-02-23 MED ORDER — ONDANSETRON HCL 4 MG/2ML IJ SOLN: 4.0000 mg | Freq: Three times a day (TID) | INTRAMUSCULAR | Status: DC | PRN

## 2018-02-23 MED ORDER — SIMETHICONE 80 MG PO CHEW: 80.0000 mg | CHEWABLE_TABLET | ORAL | Status: DC | PRN

## 2018-02-23 MED ORDER — OXYCODONE HCL 5 MG PO TABS: 5.0000 mg | ORAL_TABLET | ORAL | Status: DC | PRN

## 2018-02-23 MED ORDER — MEPERIDINE HCL 25 MG/ML IJ SOLN: 6.2500 mg | INTRAMUSCULAR | Status: DC | PRN

## 2018-02-23 MED ORDER — NALOXONE HCL 0.4 MG/ML IJ SOLN: 0.4000 mg | INTRAMUSCULAR | Status: DC | PRN

## 2018-02-23 MED ORDER — ACETAMINOPHEN 325 MG PO TABS
650.0000 mg | ORAL_TABLET | ORAL | Status: DC | PRN
  Administered 2018-02-23 – 2018-02-26 (×2): 650 mg via ORAL
  Filled 2018-02-23 (×2): qty 2

## 2018-02-23 MED ORDER — IBUPROFEN 800 MG PO TABS
800.0000 mg | ORAL_TABLET | Freq: Three times a day (TID) | ORAL | Status: DC
  Administered 2018-02-23 – 2018-02-26 (×9): 800 mg via ORAL
  Filled 2018-02-23 (×9): qty 1

## 2018-02-23 MED ORDER — LACTATED RINGERS IV BOLUS
500.0000 mL | Freq: Once | INTRAVENOUS | Status: AC
  Administered 2018-02-23: 500 mL via INTRAVENOUS

## 2018-02-23 MED ORDER — CEFAZOLIN SODIUM-DEXTROSE 2-4 GM/100ML-% IV SOLN
2.0000 g | INTRAVENOUS | Status: DC
  Filled 2018-02-23: qty 100

## 2018-02-23 MED ORDER — FENTANYL CITRATE (PF) 100 MCG/2ML IJ SOLN
INTRAMUSCULAR | Status: DC | PRN
  Administered 2018-02-23: 15 ug via INTRATHECAL

## 2018-02-23 MED ORDER — OXYTOCIN 40 UNITS IN LACTATED RINGERS INFUSION - SIMPLE MED: 2.5000 [IU]/h | INTRAVENOUS | Status: AC

## 2018-02-23 MED ORDER — NALBUPHINE HCL 10 MG/ML IJ SOLN
5.0000 mg | INTRAMUSCULAR | Status: DC | PRN
  Administered 2018-02-23: 5 mg via INTRAVENOUS
  Filled 2018-02-23: qty 1

## 2018-02-23 MED ORDER — SIMETHICONE 80 MG PO CHEW
80.0000 mg | CHEWABLE_TABLET | Freq: Three times a day (TID) | ORAL | Status: DC
  Administered 2018-02-24 – 2018-02-25 (×3): 80 mg via ORAL
  Filled 2018-02-23 (×5): qty 1

## 2018-02-23 MED ORDER — DIPHENHYDRAMINE HCL 25 MG PO CAPS: 25.0000 mg | ORAL_CAPSULE | ORAL | Status: DC | PRN

## 2018-02-23 MED ORDER — PHENYLEPHRINE 8 MG IN D5W 100 ML (0.08MG/ML) PREMIX OPTIME
INJECTION | INTRAVENOUS | Status: AC
  Filled 2018-02-23: qty 100

## 2018-02-23 MED ORDER — HYDROMORPHONE HCL 1 MG/ML IJ SOLN: 0.2500 mg | INTRAMUSCULAR | Status: DC | PRN

## 2018-02-23 MED ORDER — DIPHENHYDRAMINE HCL 25 MG PO CAPS: 25.0000 mg | ORAL_CAPSULE | Freq: Four times a day (QID) | ORAL | Status: DC | PRN

## 2018-02-23 MED ORDER — TETANUS-DIPHTH-ACELL PERTUSSIS 5-2.5-18.5 LF-MCG/0.5 IM SUSP: 0.5000 mL | Freq: Once | INTRAMUSCULAR | Status: DC

## 2018-02-23 MED ORDER — NALBUPHINE HCL 10 MG/ML IJ SOLN: 5.0000 mg | INTRAMUSCULAR | Status: DC | PRN

## 2018-02-23 MED ORDER — LACTATED RINGERS IV SOLN
INTRAVENOUS | Status: DC
  Administered 2018-02-23 (×2): via INTRAVENOUS

## 2018-02-23 MED ORDER — DIPHENHYDRAMINE HCL 50 MG/ML IJ SOLN: 12.5000 mg | INTRAMUSCULAR | Status: DC | PRN

## 2018-02-23 MED ORDER — SENNOSIDES-DOCUSATE SODIUM 8.6-50 MG PO TABS
2.0000 | ORAL_TABLET | ORAL | Status: DC
  Administered 2018-02-23 – 2018-02-24 (×2): 2 via ORAL
  Filled 2018-02-23 (×3): qty 2

## 2018-02-23 MED ORDER — OXYTOCIN 10 UNIT/ML IJ SOLN
INTRAVENOUS | Status: DC | PRN
  Administered 2018-02-23: 40 [IU] via INTRAVENOUS

## 2018-02-23 MED ORDER — KETOROLAC TROMETHAMINE 30 MG/ML IJ SOLN: 30.0000 mg | Freq: Four times a day (QID) | INTRAMUSCULAR | Status: AC | PRN

## 2018-02-23 MED ORDER — LACTATED RINGERS IV SOLN
INTRAVENOUS | Status: DC | PRN
  Administered 2018-02-23: 09:00:00 via INTRAVENOUS

## 2018-02-23 MED ORDER — ZOLPIDEM TARTRATE 5 MG PO TABS: 5.0000 mg | ORAL_TABLET | Freq: Every evening | ORAL | Status: DC | PRN

## 2018-02-23 MED ORDER — SODIUM CHLORIDE 0.9% FLUSH: 3.0000 mL | INTRAVENOUS | Status: DC | PRN

## 2018-02-23 SURGICAL SUPPLY — 37 items
BENZOIN TINCTURE PRP APPL 2/3 (GAUZE/BANDAGES/DRESSINGS) ×3 IMPLANT
CHLORAPREP W/TINT 26ML (MISCELLANEOUS) ×3 IMPLANT
CLAMP CORD UMBIL (MISCELLANEOUS) ×3 IMPLANT
CLOSURE WOUND 1/2 X4 (GAUZE/BANDAGES/DRESSINGS) ×1
CLOTH BEACON ORANGE TIMEOUT ST (SAFETY) ×3 IMPLANT
DRSG OPSITE POSTOP 4X10 (GAUZE/BANDAGES/DRESSINGS) ×3 IMPLANT
ELECT REM PT RETURN 9FT ADLT (ELECTROSURGICAL) ×3
ELECTRODE REM PT RTRN 9FT ADLT (ELECTROSURGICAL) ×1 IMPLANT
EXTRACTOR VACUUM M CUP 4 TUBE (SUCTIONS) ×2 IMPLANT
EXTRACTOR VACUUM M CUP 4' TUBE (SUCTIONS) ×1
GLOVE BIO SURGEON STRL SZ 6.5 (GLOVE) ×2 IMPLANT
GLOVE BIO SURGEONS STRL SZ 6.5 (GLOVE) ×1
GLOVE BIOGEL PI IND STRL 7.0 (GLOVE) ×1 IMPLANT
GLOVE BIOGEL PI INDICATOR 7.0 (GLOVE) ×2
GOWN STRL REUS W/TWL LRG LVL3 (GOWN DISPOSABLE) ×6 IMPLANT
KIT ABG SYR 3ML LUER SLIP (SYRINGE) IMPLANT
NEEDLE HYPO 25X5/8 SAFETYGLIDE (NEEDLE) IMPLANT
NS IRRIG 1000ML POUR BTL (IV SOLUTION) ×3 IMPLANT
PACK C SECTION WH (CUSTOM PROCEDURE TRAY) ×3 IMPLANT
PAD OB MATERNITY 4.3X12.25 (PERSONAL CARE ITEMS) ×3 IMPLANT
PENCIL SMOKE EVAC W/HOLSTER (ELECTROSURGICAL) ×3 IMPLANT
RTRCTR C-SECT PINK 25CM LRG (MISCELLANEOUS) ×3 IMPLANT
STRIP CLOSURE SKIN 1/2X4 (GAUZE/BANDAGES/DRESSINGS) ×2 IMPLANT
SUT MNCRL 0 VIOLET CTX 36 (SUTURE) ×2 IMPLANT
SUT MONOCRYL 0 CTX 36 (SUTURE) ×4
SUT PLAIN 1 NONE 54 (SUTURE) IMPLANT
SUT PLAIN 2 0 (SUTURE) ×2
SUT PLAIN 2 0 XLH (SUTURE) ×3 IMPLANT
SUT PLAIN ABS 2-0 CT1 27XMFL (SUTURE) ×1 IMPLANT
SUT VIC AB 0 CT1 27 (SUTURE) ×4
SUT VIC AB 0 CT1 27XBRD ANBCTR (SUTURE) ×2 IMPLANT
SUT VIC AB 2-0 CT1 27 (SUTURE) ×2
SUT VIC AB 2-0 CT1 TAPERPNT 27 (SUTURE) ×1 IMPLANT
SUT VIC AB 4-0 KS 27 (SUTURE) ×3 IMPLANT
SYR BULB IRRIGATION 50ML (SYRINGE) ×3 IMPLANT
TOWEL OR 17X24 6PK STRL BLUE (TOWEL DISPOSABLE) ×3 IMPLANT
TRAY FOLEY W/BAG SLVR 14FR LF (SET/KITS/TRAYS/PACK) ×3 IMPLANT

## 2018-02-23 NOTE — Brief Op Note (Signed)
02/23/2018  10:17 AM  PATIENT:  Jessica Warner  42 y.o. female  PRE-OPERATIVE DIAGNOSIS:  repeat C-Section  POST-OPERATIVE DIAGNOSIS:  repeat C-Section  PROCEDURE:  Procedure(s) with comments: REPEAT CESAREAN SECTION (N/A) - RNFA  SURGEON:  Surgeon(s) and Role:    * Bovard-Stuckert, Everlee Quakenbush, MD - Primary  ASSISTANTS: Maida Sale RNFA   ANESTHESIA:   spinal  EBL:  778 mL   FINDINGS: viable female infant at 9:22, apgars 8/9, wt P, nl uterus, tubes and ovaries  BLOOD ADMINISTERED:none  DRAINS: Urinary Catheter (Foley)   LOCAL MEDICATIONS USED:  NONE  SPECIMEN:  Source of Specimen:  Placenta  DISPOSITION OF SPECIMEN:  L&D  COUNTS:  YES  TOURNIQUET:  * No tourniquets in log *  DICTATION: .Other Dictation: Dictation Number [id number]  PLAN OF CARE: Admit to inpatient   PATIENT DISPOSITION:  PACU - hemodynamically stable.   Delay start of Pharmacological VTE agent (>24hrs) due to surgical blood loss or risk of bleeding: not applicable

## 2018-02-23 NOTE — Anesthesia Procedure Notes (Signed)
Spinal  Patient location during procedure: OB Start time: 02/23/2018 8:55 AM End time: 02/23/2018 9:00 AM Staffing Anesthesiologist: Barnet Glasgow, MD Performed: anesthesiologist  Preanesthetic Checklist Completed: patient identified, surgical consent, pre-op evaluation, timeout performed, IV checked, risks and benefits discussed and monitors and equipment checked Spinal Block Patient position: sitting Prep: site prepped and draped and DuraPrep Patient monitoring: heart rate, cardiac monitor, continuous pulse ox and blood pressure Approach: midline Location: L3-4 Injection technique: single-shot Needle Needle type: Pencan  Needle gauge: 24 G Needle length: 10 cm Needle insertion depth: 4 cm Assessment Sensory level: T4 Additional Notes 1 attempt , pt tolerated procedure well.

## 2018-02-23 NOTE — Anesthesia Postprocedure Evaluation (Signed)
Anesthesia Post Note  Patient: Network engineer  Procedure(s) Performed: REPEAT CESAREAN SECTION (N/A )     Patient location during evaluation: Mother Baby Anesthesia Type: Spinal Level of consciousness: awake Pain management: satisfactory to patient Vital Signs Assessment: post-procedure vital signs reviewed and stable Respiratory status: spontaneous breathing Cardiovascular status: stable Anesthetic complications: no    Last Vitals:  Vitals:   02/23/18 1530 02/23/18 1800  BP: (!) 99/54 (!) 97/58  Pulse: 79 80  Resp: 20 18  Temp: 36.8 C 36.7 C  SpO2: 95% 96%    Last Pain:  Vitals:   02/23/18 1720  TempSrc:   PainSc: 0-No pain   Pain Goal:                 [employer]

## 2018-02-23 NOTE — Op Note (Signed)
Jessica, Warner MEDICAL RECORD [id number] ACCOUNT [id number] DATE OF BIRTH:[birth year] FACILITY: [facility] LOCATION: [id number] PHYSICIAN:Kazi Reppond BOVARD-STUCKERT, MD  OPERATIVE REPORT  DATE OF PROCEDURE:  02/23/2018  PREOPERATIVE DIAGNOSES:  Intrauterine pregnancy at term for repeat low transverse cesarean section.  POSTOPERATIVE DIAGNOSES:  Intrauterine pregnancy at term for repeat low transverse cesarean section, delivered.  PROCEDURE:  Repeat low transverse cesarean section.  SURGEON:  Richrd Prime, MD.  ASSISTANT:  Lester Malta, RNFA.  ANESTHESIA:  Spinal.  COMPLICATIONS:  None.  ESTIMATED BLOOD LOSS:  778 mL  URINE OUTPUT AND IV FLUIDS:  Per anesthesia notes.  URINE OUTPUT:  Clear at the end of the procedure.  FINDINGS:  Viable female infant at 9:22 with Apgar's of 8 at 1 minute and 9 at 5 minutes and weight pending at the time of dictation.  Normal uterus, tubes and ovaries are noted.  DESCRIPTION OF PROCEDURE:  After informed consent was discussed with the patient including risks, benefits and alternatives of the surgical procedure, she was transferred to the operating room where spinal anesthesia was placed and found to be adequate.   She was then placed on the table in supine position with a leftward tilt, prepped and draped in the normal sterile fashion.  A Foley catheter was sterilely inserted.  A Pfannenstiel skin incision was made at the level of her previous incision and  carried through to the underlying layer of fascia sharply.  The fascia was incised in the midline.  The incision was extended laterally with Mayo scissors.  Superior aspect of the fascial incision was grasped with Kocher clamps, elevated and the rectus  muscles were dissected off both bluntly and sharply.  The peritoneum was then entered bluntly and this incision was extended superiorly and inferiorly with good visualization of the bladder.  An Alexis skin retractor was placed carefully making sure  that  no bowel was entrapped.  The uterus was explored.  Infant noted to be in a vertex presentation.  The bladder flap was created.  Uterus was incised both digitally and sharply.  Uterus was incised in a transverse fashion.  Infant was delivered with the  aid of a vacuum.  Nose and mouth were suctioned on the field.  The cord was clamped after waiting a minute.  The infant was handed off to the waiting pediatric staff.  The placenta was expressed.  The uterus was cleared of all clot and debris.  The  uterine incision was closed with two layers of 0 Monocryl, the first of which is running locked and the second as an imbricating layer.  The gutters were cleared of all clot and debris.  The Alexis retractor was removed.  The peritoneum was  reapproximated using 2-0 Vicryl.  The subfascial planes were inspected and found to be hemostatic.  The fascia was then reapproximated with 0 Vicryl from either end overlapping in the midline.  The subcuticular adipose tissue was made hemostatic.  The  dead space was closed with plain gut suture.  The skin was closed with 4-0 Vicryl on a Keith needle.  Benzoin and Steri-Strips were applied.  The patient tolerated the procedure well.  Sponge, lap and needle count was correct x2 per the operating staff.  TN/NUANCE  D:02/23/2018 T:02/23/2018 JOB:[id number]

## 2018-02-23 NOTE — Interval H&P Note (Signed)
History and Physical Interval Note:  02/23/2018 8:50 AM  Jessica Warner  has presented today for surgery, with the diagnosis of repeat C-Section  The various methods of treatment have been discussed with the patient and family. After consideration of risks, benefits and other options for treatment, the patient has consented to  Procedure(s) with comments: REPEAT CESAREAN SECTION (N/A) - RNFA as a surgical intervention .  The patient's history has been reviewed, patient examined, no change in status, stable for surgery.  I have reviewed the patient's chart and labs.  Questions were answered to the patient's satisfaction.     Murriel Eidem Bovard-Stuckert

## 2018-02-23 NOTE — Lactation Note (Signed)
This note was copied from a baby's chart. Lactation Consultation Note Baby 18 hrs old. Mom holding baby STS. States feedings going ok but baby will not open mouth very wide. Mom holding baby in cradle laid back position. Baby latched well. Demonstrated chin tug, touching top lip w/nipple to stimulate to open wider, waiting on wider flange before latching. Mom stated much better. Mom has great everted nipples. Hand expression demonstrated w/drop of thick colostrum noted. Mom demonstrated hand expression. Mom has a 79 yr old that she BF for 11 months. At the beginning she just BF, then later she BF/Formula fed. No difficulties noted. Mom encouraged to feed baby 8-12 times/24 hours and with feeding cues. Educated about newborn behavior, STS, I&O, cluster feeding, supply and demand. Encouraged to call for assistance or questions. [facility] brochure given w/resources, support groups and [place] services.  Patient Name: Jessica Warner RUEAV'W Date: 02/23/2018 Reason for consult: Initial assessment   Maternal Data Has patient been taught Hand Expression?: Yes Does the patient have breastfeeding experience prior to this delivery?: Yes  Feeding Feeding Type: Breast Fed Length of feed: 10 min(still feeding)  LATCH Score Latch: Grasps breast easily, tongue down, lips flanged, rhythmical sucking.  Audible Swallowing: A few with stimulation  Type of Nipple: Everted at rest and after stimulation  Comfort (Breast/Nipple): Soft / non-tender  Hold (Positioning): Assistance needed to correctly position infant at breast and maintain latch.  LATCH Score: 8  Interventions Interventions: Breast feeding basics reviewed;Support pillows;Assisted with latch;Position options;Skin to skin;Breast massage;Hand express;Breast compression;Adjust position  Lactation Tools Discussed/Used WIC Program: No   Consult Status Consult Status: Follow-up Date: 02/24/18 Follow-up type: In-patient    Theodoro Kalata 02/23/2018, 9:50 PM

## 2018-02-23 NOTE — Anesthesia Postprocedure Evaluation (Signed)
Anesthesia Post Note  Patient: Network engineer  Procedure(s) Performed: REPEAT CESAREAN SECTION (N/A )     Patient location during evaluation: Mother Baby Anesthesia Type: Spinal Level of consciousness: awake Pain management: pain level controlled Vital Signs Assessment: post-procedure vital signs reviewed and stable Respiratory status: spontaneous breathing Cardiovascular status: stable Postop Assessment: no headache, no backache, spinal receding, patient able to bend at knees, no apparent nausea or vomiting and adequate PO intake Anesthetic complications: no    Last Vitals:  Vitals:   02/23/18 1115 02/23/18 1134  BP: (!) 86/61 90/60  Pulse: 64 68  Resp: 14 16  Temp:  36.7 C  SpO2: 98% 98%    Last Pain:  Vitals:   02/23/18 1134  TempSrc: Oral  PainSc: 0-No pain   Pain Goal:                 Jessica Warner

## 2018-02-23 NOTE — Progress Notes (Signed)
Patient ID: Jessica Warner, female   DOB: June 03, 1976, 42 y.o.   MRN: [id number] Called and informed pts BP has been low most of day and was just now 84/53.  Came to see pt. She reports feeling lightheaded most of the day since surgery - worse when turns head from side to side. She denies any nausea, fever, chills and pain well controlled. Has voided well per foley. No SOB or CP VS: 81-97/50-60 all day  PE: GEN - NAD, color in cheeks, speaks in full uninterrupted sentences        ABD - soft, ND, mild incisional tenderness, no distension, no guarding        EXT - no homans  A: 42yo [id number] female on POD#0 s/p r c/s with normal ebl      Will check cbc now      LR fluid bolus of 574ml after blood draw      Encourage to eat now as has not eaten much all day

## 2018-02-23 NOTE — Transfer of Care (Signed)
Immediate Anesthesia Transfer of Care Note  Patient: Jessica Warner  Procedure(s) Performed: REPEAT CESAREAN SECTION (N/A )  Patient Location: PACU  Anesthesia Type:Spinal  Level of Consciousness: awake  Airway & Oxygen Therapy: Patient Spontanous Breathing  Post-op Assessment: Report given to RN  Post vital signs: Reviewed and stable  Last Vitals:  Vitals Value Taken Time  BP    Temp    Pulse 80 02/23/2018 10:05 AM  Resp    SpO2 96 % 02/23/2018 10:05 AM  Vitals shown include unvalidated device data.  Last Pain:  Vitals:   02/23/18 0752  TempSrc: Oral         Complications: No apparent anesthesia complications

## 2018-02-24 ENCOUNTER — Other Ambulatory Visit: Payer: Self-pay

## 2018-02-24 LAB — CBC
HCT: 33.2 % — ABNORMAL LOW (ref 36.0–46.0)
Hemoglobin: 11.3 g/dL — ABNORMAL LOW (ref 12.0–15.0)
MCH: 33.2 pg (ref 26.0–34.0)
MCHC: 34 g/dL (ref 30.0–36.0)
MCV: 97.6 fL (ref 78.0–100.0)
PLATELETS: 177 10*3/uL (ref 150–400)
RBC: 3.4 MIL/uL — AB (ref 3.87–5.11)
RDW: 14 % (ref 11.5–15.5)
WBC: 10.5 10*3/uL (ref 4.0–10.5)

## 2018-02-24 LAB — BIRTH TISSUE RECOVERY COLLECTION (PLACENTA DONATION)

## 2018-02-24 NOTE — Progress Notes (Signed)
When the RN's went in to do the patient's 4 hour assessment, mom's blood pressure was ~80/50's the first time and when rechecked it was 80/53. Throughout her stay, the patient's blood sugars have been running low, ranging from 29-93 systolic and 71'I diastolic. The RN also asked the patient was feeling dizzy, had any headaches, or was seeing any spots in front of her eyes. Mom stated that she felt "dizzy, saw spots in front of her eyes, and had a slight headache". The RN urged the patient to eat some more, advising her to take it slow to reduce any risk for her vomiting and putting pressure on her incision. The RN then decided to call Dr. Terri Piedra who was on call to let her know about the patient's status. Dr. Terri Piedra then came to the patient's bedside to assess the her. Dr. Terri Piedra decided to order a repeat CBC and bolus the patient 500 ml over 30 minutes. The RN was to notify the doctor if her hemoglobin was <10.0. After the bolus was completed, mom was sitting up in the bed and seemed more energetic. Mom was also eating. Her CBC came back and her hemoglobin was 10.8. The patient was advised to call for help when she needed to ambulate to use the restroom. The RN will continue to monitor the patient's progress.

## 2018-02-24 NOTE — Progress Notes (Signed)
Subjective: Postpartum Day 1: Cesarean Delivery Patient reports nausea, vomiting, incisional pain, tolerating PO and no problems voiding.  Nl lochia, pain controlled.  Some dizziness overnight  Objective: Vital signs in last 24 hours: Temp:  [97.7 F (36.5 C)-99.4 F (37.4 C)] 98.7 F (37.1 C) (08/27 0603) Pulse Rate:  [64-93] 68 (08/27 0603) Resp:  [10-20] 18 (08/26 2230) BP: (79-99)/(50-71) 79/52 (08/27 0603) SpO2:  [95 %-100 %] 96 % (08/27 0603)  Physical Exam:  General: alert and no distress Lochia: appropriate Uterine Fundus: firm Incision: healing well DVT Evaluation: No evidence of DVT seen on physical exam.  Recent Labs    02/23/18 2316 02/24/18 0519  HGB 10.8* 11.3*  HCT 31.5* 33.2*    Assessment/Plan: Status post Cesarean section. Doing well postoperatively.  Continue current care.  Hope Brandenburger Bovard-Stuckert 02/24/2018, 8:07 AM

## 2018-02-24 NOTE — Plan of Care (Signed)
  Problem: Activity: Goal: Ability to tolerate increased activity will improve Outcome: Progressing Note:  Pt has ambulated once this shift in the hallway & tolerated well.   Problem: Elimination: Goal: Will not experience complications related to bowel motility Outcome: Progressing Note:  Pt has active bowels sounds & reports flatus today. Goal: Will not experience complications related to urinary retention Outcome: Progressing Note:  Pt voids large amts on a regular basis.     Problem: Pain Managment: Goal: General experience of comfort will improve Outcome: Progressing Note:  Pt pain has been managed appropriately with scheduled Motrin.   Problem: Safety: Goal: Ability to remain free from injury will improve Outcome: Progressing

## 2018-02-25 MED ORDER — IBUPROFEN 800 MG PO TABS: 800.0000 mg | ORAL_TABLET | Freq: Three times a day (TID) | ORAL | 0 refills | Status: DC

## 2018-02-25 NOTE — Progress Notes (Signed)
POD #2 Feeling better, not as lightheaded, wants to go home Afeb, VSS Abd- soft, fundus firm, incision intact D/c home

## 2018-02-25 NOTE — Discharge Instructions (Signed)
As per discharge pamphlet °

## 2018-02-25 NOTE — Discharge Summary (Addendum)
    OB Discharge Summary     Patient Name: Jessica Warner DOB: [birth year] MRN: [id number]  Date of admission: 02/23/2018 Delivering MD: Janyth Contes   Date of discharge: 02/26/2018  Admitting diagnosis: repeat C-Section Intrauterine pregnancy: [redacted]w[redacted]d     Secondary diagnosis:  Principal Problem:   Status post repeat low transverse cesarean section Active Problems:   History of cesarean delivery      Discharge diagnosis: Term Pregnancy [facility] course:  Sceduled C/S   42 y.o. yo [id number] at [redacted]w[redacted]d was admitted to the hospital 02/23/2018 for scheduled cesarean section with the following indication:Elective Repeat.  Membrane Rupture Time/Date: 9:18 AM ,02/23/2018   Patient delivered a Viable infant.02/23/2018  Details of operation can be found in separate operative note.  Pateint had an uncomplicated postpartum course.  She is ambulating, tolerating a regular diet, passing flatus, and urinating well. Patient is discharged home in stable condition on  02/26/18         Physical exam  Vitals:   02/25/18 0545 02/25/18 1500 02/25/18 2146 02/26/18 0459  BP: 93/61 97/65 103/61 102/66  Pulse: 61 75 71 72  Resp: 18 18 16 16   Temp: 97.6 F (36.4 C) 98.1 F (36.7 C) 98 F (36.7 C) 98.2 F (36.8 C)  TempSrc: Oral Oral  Oral  SpO2: 98% 99% 97%   Weight:      Height:       General: alert Lochia: appropriate Uterine Fundus: firm Incision: Healing well with no significant drainage  Labs: Lab Results  Component Value Date   WBC 10.5 02/24/2018   HGB 11.3 (L) 02/24/2018   HCT 33.2 (L) 02/24/2018   MCV 97.6 02/24/2018   PLT 177 02/24/2018   CMP Latest Ref Rng & Units 09/12/2016  Glucose 70 - 99 mg/dL 77  BUN 6 - 23 mg/dL 14  Creatinine 0.40 - 1.20 mg/dL 0.65  Sodium 135 - 145 mEq/L 142  Potassium 3.5 - 5.1 mEq/L 3.8  Chloride 96 - 112 mEq/L 104  CO2 19 - 32 mEq/L 29  Calcium 8.4 - 10.5 mg/dL 9.6  Total Protein 6.0 - 8.3 g/dL 6.9   Total Bilirubin 0.2 - 1.2 mg/dL 0.6  Alkaline Phos 39 - 117 U/L 51  AST 0 - 37 U/L 17  ALT 0 - 35 U/L 16    Discharge instruction: per After Visit Summary and "Baby and Me Booklet".  After visit meds:  Allergies as of 02/26/2018   No Known Allergies     Medication List    TAKE these medications   ibuprofen 200 MG tablet Commonly known as:  ADVIL,MOTRIN Take 3 tablets (600 mg total) by mouth every 6 (six) hours as needed.   PRENATAL PO Take 1 tablet by mouth every evening.       Diet: routine diet  Activity: Advance as tolerated. Pelvic rest for 6 weeks.   Outpatient follow up:2 weeks  Newborn Data: Live born female  Birth Weight: 8 lb 12 oz (3970 g) APGAR: 49, 9  Newborn Delivery   Birth date/time:  02/23/2018 09:22:00 Delivery type:  C-Section, Vacuum Assisted Trial of labor:  No C-section categorization:  Repeat     Baby Feeding: Breast Disposition:home with mother   02/26/2018 Logan Bores, MD

## 2018-02-25 NOTE — Lactation Note (Signed)
This note was copied from a baby's chart. Lactation Consultation Note  Patient Name: Jessica Warner [id number] Date: 02/25/2018 Reason for consult: Follow-up assessment;Term P2, 20 hour old female infant, mom with 2nd c/s delivery. Infant weight loss -5% Per mom, gave formula just wanted a rest, stated she  last feed infant at 11 pm infant, infant was given 45 ml of formula (Enfamil) at 38 hours which is excessive based on hours after birth. Per mom, she plans to BF not give  Infant any more formula  she wanted a rest. Infant was cuing in basinet but mom did not want to feed infant at this time. Mom told LC maybe she will feed him 1 hour from now. LC reinforced feed infant according hunger cues, 8 to 12 times within 24 hours not exceed feeding infant past 3 hours.   LC discussed engorgement prevention and treatment.  Mom made aware of O/P services, breastfeeding support groups, community resources, and our phone # for post-discharge questions.  Maternal Data    Feeding Feeding Type: Bottle Fed - Formula  LATCH Score                   Interventions    Lactation Tools Discussed/Used     Consult Status      Vicente Serene 02/25/2018, 12:59 AM

## 2018-02-25 NOTE — Progress Notes (Signed)
Baby needed to be seen tomorrow, so patient decided not to go home today, continue routine care

## 2018-02-26 MED ORDER — IBUPROFEN 200 MG PO TABS: 600.0000 mg | ORAL_TABLET | Freq: Four times a day (QID) | ORAL | 0 refills | Status: DC | PRN

## 2018-02-26 NOTE — Lactation Note (Signed)
This note was copied from a baby's chart. Lactation Consultation Note  Patient Name: Jessica Warner [id number] Date: 02/26/2018 Reason for consult: Follow-up assessment Mom reports that breastfeeding is going well.  Breasts are filling today.  She became engorged with first baby.  Engorgement prevention and treatment reviewed.  Questions answered.  Lactation outpatient services and support reviewed and encouraged prn.  Maternal Data    Feeding Feeding Type: Breast Fed Length of feed: 40 min  LATCH Score Latch: Grasps breast easily, tongue down, lips flanged, rhythmical sucking.(per mother)  Audible Swallowing: A few with stimulation  Type of Nipple: Everted at rest and after stimulation  Comfort (Breast/Nipple): Soft / non-tender  Hold (Positioning): No assistance needed to correctly position infant at breast.  LATCH Score: 9  Interventions    Lactation Tools Discussed/Used     Consult Status Consult Status: Complete Follow-up type: Call as needed    Ave Filter 02/26/2018, 8:58 AM

## 2018-02-26 NOTE — Progress Notes (Signed)
Subjective: Postpartum Day 3 Cesarean Delivery Patient reports tolerating PO and no problems voiding.    Objective: Vital signs in last 24 hours: Temp:  [98 F (36.7 C)-98.2 F (36.8 C)] 98.2 F (36.8 C) (08/29 0459) Pulse Rate:  [71-75] 72 (08/29 0459) Resp:  [16-18] 16 (08/29 0459) BP: (97-103)/(61-66) 102/66 (08/29 0459) SpO2:  [97 %-99 %] 97 % (08/28 2146)  Physical Exam:  General: alert and cooperative Lochia: appropriate Uterine Fundus: firm Incision: C/D/I   Recent Labs    02/23/18 2316 02/24/18 0519  HGB 10.8* 11.3*  HCT 31.5* 33.2*    Assessment/Plan: Status post Cesarean section. Doing well postoperatively.  Discharge home with standard precautions and return to office in 2 weeks for incision check  Logan Bores 02/26/2018, 9:03 AM

## 2018-04-07 ENCOUNTER — Other Ambulatory Visit: Payer: Self-pay | Admitting: Obstetrics and Gynecology

## 2018-04-07 DIAGNOSIS — N63 Unspecified lump in unspecified breast: Secondary | ICD-10-CM | POA: Diagnosis not present

## 2018-04-07 DIAGNOSIS — G5603 Carpal tunnel syndrome, bilateral upper limbs: Secondary | ICD-10-CM | POA: Diagnosis not present

## 2018-04-07 DIAGNOSIS — N61 Mastitis without abscess: Secondary | ICD-10-CM

## 2018-04-07 DIAGNOSIS — Z3009 Encounter for other general counseling and advice on contraception: Secondary | ICD-10-CM | POA: Diagnosis not present

## 2018-04-07 DIAGNOSIS — Z1389 Encounter for screening for other disorder: Secondary | ICD-10-CM | POA: Diagnosis not present

## 2018-04-13 ENCOUNTER — Ambulatory Visit
Admission: RE | Admit: 2018-04-13 | Discharge: 2018-04-13 | Disposition: A | Discharge status: Home / Self Care | Payer: 59 | Source: Ambulatory Visit | Attending: Obstetrics and Gynecology | Admitting: Obstetrics and Gynecology

## 2018-04-13 ENCOUNTER — Other Ambulatory Visit: Payer: 59

## 2018-04-13 DIAGNOSIS — N6310 Unspecified lump in the right breast, unspecified quadrant: Secondary | ICD-10-CM | POA: Diagnosis not present

## 2018-04-13 DIAGNOSIS — N63 Unspecified lump in unspecified breast: Secondary | ICD-10-CM | POA: Insufficient documentation

## 2018-04-13 DIAGNOSIS — N61 Mastitis without abscess: Secondary | ICD-10-CM

## 2018-04-13 DIAGNOSIS — R922 Inconclusive mammogram: Secondary | ICD-10-CM | POA: Diagnosis not present

## 2019-02-22 DIAGNOSIS — Z130 Encounter for screening for diseases of the blood and blood-forming organs and certain disorders involving the immune mechanism: Secondary | ICD-10-CM | POA: Diagnosis not present

## 2019-02-22 DIAGNOSIS — Z1389 Encounter for screening for other disorder: Secondary | ICD-10-CM | POA: Diagnosis not present

## 2019-02-22 DIAGNOSIS — Z6823 Body mass index (BMI) 23.0-23.9, adult: Secondary | ICD-10-CM | POA: Diagnosis not present

## 2019-02-22 DIAGNOSIS — Z01419 Encounter for gynecological examination (general) (routine) without abnormal findings: Secondary | ICD-10-CM | POA: Diagnosis not present

## 2019-02-22 DIAGNOSIS — Z3009 Encounter for other general counseling and advice on contraception: Secondary | ICD-10-CM | POA: Diagnosis not present

## 2019-11-12 DIAGNOSIS — Z20828 Contact with and (suspected) exposure to other viral communicable diseases: Secondary | ICD-10-CM | POA: Diagnosis not present

## 2019-11-12 DIAGNOSIS — Z03818 Encounter for observation for suspected exposure to other biological agents ruled out: Secondary | ICD-10-CM | POA: Diagnosis not present

## 2020-03-17 ENCOUNTER — Encounter: Payer: Self-pay | Admitting: Gastroenterology

## 2020-04-03 ENCOUNTER — Encounter (HOSPITAL_COMMUNITY): Payer: Self-pay

## 2020-04-03 ENCOUNTER — Ambulatory Visit (HOSPITAL_COMMUNITY)
Admission: EM | Admit: 2020-04-03 | Discharge: 2020-04-03 | Disposition: A | Discharge status: Home / Self Care | Payer: 59 | Attending: Urgent Care | Admitting: Urgent Care

## 2020-04-03 DIAGNOSIS — R110 Nausea: Secondary | ICD-10-CM | POA: Insufficient documentation

## 2020-04-03 DIAGNOSIS — R42 Dizziness and giddiness: Secondary | ICD-10-CM | POA: Insufficient documentation

## 2020-04-03 LAB — CBC
HCT: 42.4 % (ref 36.0–46.0)
Hemoglobin: 13.8 g/dL (ref 12.0–15.0)
MCH: 29.9 pg (ref 26.0–34.0)
MCHC: 32.5 g/dL (ref 30.0–36.0)
MCV: 92 fL (ref 80.0–100.0)
Platelets: 246 10*3/uL (ref 150–400)
RBC: 4.61 MIL/uL (ref 3.87–5.11)
RDW: 12.4 % (ref 11.5–15.5)
WBC: 7.2 10*3/uL (ref 4.0–10.5)
nRBC: 0 % (ref 0.0–0.2)

## 2020-04-03 MED ORDER — CARBAMIDE PEROXIDE 6.5 % OT SOLN
OTIC | Status: AC
  Filled 2020-04-03: qty 15

## 2020-04-03 MED ORDER — CARBAMIDE PEROXIDE 6.5 % OT SOLN
5.0000 [drp] | Freq: Once | OTIC | Status: AC
  Administered 2020-04-03: 5 [drp] via OTIC

## 2020-04-03 MED ORDER — MECLIZINE HCL 12.5 MG PO TABS: 12.5000 mg | ORAL_TABLET | Freq: Three times a day (TID) | ORAL | 0 refills | Status: DC | PRN

## 2020-04-03 NOTE — ED Triage Notes (Signed)
Pt reports feeling dizzy, nauseous and numbness in hands since this morning. States the room is spinning. Denies chest pain, diarrhea, abdominal pain, sob.

## 2020-04-03 NOTE — ED Provider Notes (Signed)
[place]   MRN: [id number] DOB: [birth year]  Subjective:   Jessica Warner is a 44 y.o. female presenting for acute onset this morning of persistent constant dizziness, vertigo with associated nausea without vomiting. Has also started having bilateral tingling and numbness of her hands. Denies fever, headache, tinnitus, runny stuffy nose, sore throat, cough, chest pain, shob, vomiting, abdominal pain. Denies hx of similar sx. Denies hx of heart disease. Has concerns that she could be having a stroke. No personal or family hx of the same. No weakness, confusion, pica, hx of anemia. Hydrates well. Takes OCP, occasional vitamin. Does not want a pregnancy test.   No current facility-administered medications for this encounter.  Current Outpatient Medications:  .  ibuprofen (MOTRIN IB) 200 MG tablet, Take 3 tablets (600 mg total) by mouth every 6 (six) hours as needed., Disp: 30 tablet, Rfl: 0 .  norethindrone-ethinyl estradiol (LOESTRIN) 1-20 MG-MCG tablet, Take by mouth., Disp: , Rfl:  .  Prenatal Vit-Fe Fumarate-FA (PRENATAL PO), Take 1 tablet by mouth every evening., Disp: , Rfl:    No Known Allergies  Past Medical History:  Diagnosis Date  . GERD (gastroesophageal reflux disease)   . History of cesarean delivery 02/22/2018  . Status post repeat low transverse cesarean section 02/23/2018     Past Surgical History:  Procedure Laterality Date  . CESAREAN SECTION    . CESAREAN SECTION N/A 02/23/2018   Procedure: REPEAT CESAREAN SECTION;  Surgeon: Janyth Contes, MD;  Location: [facility];  Service: Obstetrics;  Laterality: N/A;  RNFA  . UPPER GASTROINTESTINAL ENDOSCOPY    . WISDOM TOOTH EXTRACTION      Family History  Problem Relation Age of Onset  . Healthy Mother   . Healthy Father   . Healthy Maternal Grandmother   . Healthy Maternal Grandfather   . Healthy Paternal Grandmother   . Healthy Paternal Grandfather   . Colon cancer Neg Hx   .  Esophageal cancer Neg Hx   . Pancreatic cancer Neg Hx   . Rectal cancer Neg Hx   . Stomach cancer Neg Hx   . Breast cancer Neg Hx     Social History   Tobacco Use  . Smoking status: Never Smoker  . Smokeless tobacco: Never Used  Vaping Use  . Vaping Use: Never used  Substance Use Topics  . Alcohol use: No    Alcohol/week: 0.0 standard drinks  . Drug use: No    ROS   Objective:   Vitals: BP 133/80 (BP Location: Left Arm)   Pulse 81   Temp 98 F (36.7 C) (Oral)   Resp 17   LMP  (Within Weeks) Comment: 1 week  SpO2 100%   Breastfeeding No   Physical Exam Constitutional:      General: She is not in acute distress.    Appearance: Normal appearance. She is well-developed. She is not ill-appearing, toxic-appearing or diaphoretic.  HENT:     Head: Normocephalic and atraumatic.     Right Ear: There is impacted cerumen.     Left Ear: There is impacted cerumen.     Nose: Nose normal. No congestion or rhinorrhea.     Mouth/Throat:     Mouth: Mucous membranes are moist.     Pharynx: Oropharynx is clear. No oropharyngeal exudate or posterior oropharyngeal erythema.  Eyes:     General: No scleral icterus.       Right eye: No discharge.  Left eye: No discharge.     Extraocular Movements: Extraocular movements intact.     Right eye: Normal extraocular motion and no nystagmus.     Left eye: Normal extraocular motion and no nystagmus.     Conjunctiva/sclera: Conjunctivae normal.     Right eye: Right conjunctiva is not injected. No chemosis, exudate or hemorrhage.    Left eye: Left conjunctiva is not injected. No chemosis, exudate or hemorrhage.    Pupils: Pupils are equal, round, and reactive to light.  Cardiovascular:     Rate and Rhythm: Normal rate and regular rhythm.     Pulses: Normal pulses.     Heart sounds: Normal heart sounds. No murmur heard.  No friction rub. No gallop.   Pulmonary:     Effort: Pulmonary effort is normal. No respiratory distress.      Breath sounds: Normal breath sounds. No stridor. No wheezing, rhonchi or rales.  Skin:    General: Skin is warm and dry.     Findings: No rash.  Neurological:     General: No focal deficit present.     Mental Status: She is alert and oriented to person, place, and time.     Cranial Nerves: No cranial nerve deficit.     Motor: No weakness.     Coordination: Coordination normal.     Gait: Gait normal.     Deep Tendon Reflexes: Reflexes normal.     Comments: Negative pronator drift. Positive Romberg.   Psychiatric:        Mood and Affect: Mood normal.        Behavior: Behavior normal.        Thought Content: Thought content normal.        Judgment: Judgment normal.     Assessment and Plan :   PDMP not reviewed this encounter.  1. Dizziness   2. Vertigo   3. Nausea     TMs clear following ear lavage. CBC pending, will use supportive care for now. Counseled patient on use of MRI, CT for ruling out intracranial process but was agreeable to trying meclizine, rest and hydrating well for now, labs pending. Will f/u with results.  Counseled patient on potential for adverse effects with medications prescribed/recommended today, ER and return-to-clinic precautions discussed, patient verbalized understanding.    Jessica Warner, [place] 04/03/20 1642

## 2020-04-04 ENCOUNTER — Encounter: Payer: Self-pay | Admitting: Family Medicine

## 2020-04-04 ENCOUNTER — Emergency Department (HOSPITAL_COMMUNITY): Payer: 59

## 2020-04-04 ENCOUNTER — Encounter (HOSPITAL_COMMUNITY): Payer: Self-pay

## 2020-04-04 ENCOUNTER — Other Ambulatory Visit: Payer: Self-pay

## 2020-04-04 ENCOUNTER — Telehealth (INDEPENDENT_AMBULATORY_CARE_PROVIDER_SITE_OTHER): Payer: 59 | Admitting: Family Medicine

## 2020-04-04 ENCOUNTER — Emergency Department (HOSPITAL_COMMUNITY)
Admission: EM | Admit: 2020-04-04 | Discharge: 2020-04-05 | Disposition: A | Discharge status: Home / Self Care | Payer: 59 | Attending: Emergency Medicine | Admitting: Emergency Medicine

## 2020-04-04 DIAGNOSIS — R110 Nausea: Secondary | ICD-10-CM | POA: Insufficient documentation

## 2020-04-04 DIAGNOSIS — R42 Dizziness and giddiness: Secondary | ICD-10-CM | POA: Insufficient documentation

## 2020-04-04 DIAGNOSIS — M542 Cervicalgia: Secondary | ICD-10-CM | POA: Diagnosis not present

## 2020-04-04 DIAGNOSIS — G8929 Other chronic pain: Secondary | ICD-10-CM | POA: Diagnosis not present

## 2020-04-04 DIAGNOSIS — D1809 Hemangioma of other sites: Secondary | ICD-10-CM | POA: Diagnosis not present

## 2020-04-04 DIAGNOSIS — R202 Paresthesia of skin: Secondary | ICD-10-CM | POA: Diagnosis not present

## 2020-04-04 DIAGNOSIS — M4802 Spinal stenosis, cervical region: Secondary | ICD-10-CM | POA: Diagnosis not present

## 2020-04-04 DIAGNOSIS — R200 Anesthesia of skin: Secondary | ICD-10-CM | POA: Diagnosis not present

## 2020-04-04 LAB — CBC WITH DIFFERENTIAL/PLATELET
Abs Immature Granulocytes: 0.01 K/uL (ref 0.00–0.07)
Basophils Absolute: 0 K/uL (ref 0.0–0.1)
Basophils Relative: 1 %
Eosinophils Absolute: 0.1 K/uL (ref 0.0–0.5)
Eosinophils Relative: 2 %
HCT: 45.3 % (ref 36.0–46.0)
Hemoglobin: 15.1 g/dL — ABNORMAL HIGH (ref 12.0–15.0)
Immature Granulocytes: 0 %
Lymphocytes Relative: 38 %
Lymphs Abs: 2.8 K/uL (ref 0.7–4.0)
MCH: 30.8 pg (ref 26.0–34.0)
MCHC: 33.3 g/dL (ref 30.0–36.0)
MCV: 92.4 fL (ref 80.0–100.0)
Monocytes Absolute: 0.5 K/uL (ref 0.1–1.0)
Monocytes Relative: 7 %
Neutro Abs: 4 K/uL (ref 1.7–7.7)
Neutrophils Relative %: 52 %
Platelets: 276 K/uL (ref 150–400)
RBC: 4.9 MIL/uL (ref 3.87–5.11)
RDW: 12.6 % (ref 11.5–15.5)
WBC: 7.4 K/uL (ref 4.0–10.5)
nRBC: 0 % (ref 0.0–0.2)

## 2020-04-04 LAB — COMPREHENSIVE METABOLIC PANEL
ALT: 27 U/L (ref 0–44)
AST: 22 U/L (ref 15–41)
Albumin: 4.5 g/dL (ref 3.5–5.0)
Alkaline Phosphatase: 56 U/L (ref 38–126)
Anion gap: 9 (ref 5–15)
BUN: 15 mg/dL (ref 6–20)
CO2: 26 mmol/L (ref 22–32)
Calcium: 9.1 mg/dL (ref 8.9–10.3)
Chloride: 103 mmol/L (ref 98–111)
Creatinine, Ser: 0.75 mg/dL (ref 0.44–1.00)
GFR calc non Af Amer: >60 mL/min (ref >60)
Glucose, Bld: 104 mg/dL — ABNORMAL HIGH (ref 70–99)
Potassium: 3.9 mmol/L (ref 3.5–5.1)
Sodium: 138 mmol/L (ref 135–145)
Total Bilirubin: 0.5 mg/dL (ref 0.3–1.2)
Total Protein: 7.5 g/dL (ref 6.5–8.1)

## 2020-04-04 LAB — TROPONIN I (HIGH SENSITIVITY): Troponin I (High Sensitivity): 2 ng/L (ref <18)

## 2020-04-04 LAB — I-STAT BETA HCG BLOOD, ED (MC, WL, AP ONLY): I-stat hCG, quantitative: <5 m[IU]/mL (ref <5)

## 2020-04-04 MED ORDER — SODIUM CHLORIDE 0.9 % IV BOLUS
500.0000 mL | Freq: Once | INTRAVENOUS | Status: AC
  Administered 2020-04-04: 500 mL via INTRAVENOUS

## 2020-04-04 MED ORDER — IOHEXOL 350 MG/ML SOLN
75.0000 mL | Freq: Once | INTRAVENOUS | Status: AC | PRN
  Administered 2020-04-04: 75 mL via INTRAVENOUS

## 2020-04-04 MED ORDER — DIAZEPAM 5 MG/ML IJ SOLN
2.5000 mg | Freq: Once | INTRAMUSCULAR | Status: DC
  Filled 2020-04-04: qty 2

## 2020-04-04 NOTE — ED Notes (Signed)
Pt ambulated in room O2 remained 100%

## 2020-04-04 NOTE — ED Notes (Signed)
Patient transported to MRI 

## 2020-04-04 NOTE — Progress Notes (Signed)
Virtual Visit via Video Note  I connected with Jessica Warner  on 04/04/20 at 11:40 AM EDT by a video enabled telemedicine application and verified that I am speaking with the correct person using two identifiers.  Location patient: home, [place] Location provider:work or home office Persons participating in the virtual visit: patient, provider  I discussed the limitations of evaluation and management by telemedicine and the availability of in person appointments. The patient expressed understanding and agreed to proceed.   HPI:  Acute telemedicine visit for : -Onset: yesterday -Symptoms include:nausea, dizziness - started acutely when went to get out of bed, feels heart racing at times, numbness of the hands bilateral started yesterday, feels vision has been worse the last few months -worse when she first gets out of bed -Reports dizziness is constant and is not precipitated by movements -went to ER yesterday and had cerumen impaction and normal cbc -was given meclizine, but this is not helping -Denies: CP, SOB, sinus issues, HA, vision changes, weakness  -Has tried: meclizine -Pertinent past medical history: chronic neck and back pain -Pertinent medication allergies: nkda -father had stroke in the past and had the same symptoms, she is worried about a stroke -COVID-19 vaccine status: fully vaccinated  ROS: See pertinent positives and negatives per HPI.  Past Medical History:  Diagnosis Date   GERD (gastroesophageal reflux disease)    History of cesarean delivery 02/22/2018   Status post repeat low transverse cesarean section 02/23/2018    Past Surgical History:  Procedure Laterality Date   CESAREAN SECTION     CESAREAN SECTION N/A 02/23/2018   Procedure: REPEAT CESAREAN SECTION;  Surgeon: Janyth Contes, MD;  Location: [facility];  Service: Obstetrics;  Laterality: N/A;  RNFA   UPPER GASTROINTESTINAL ENDOSCOPY     WISDOM TOOTH EXTRACTION       Current Outpatient  Medications:    meclizine (ANTIVERT) 12.5 MG tablet, Take 1 tablet (12.5 mg total) by mouth 3 (three) times daily as needed for dizziness., Disp: 30 tablet, Rfl: 0   norethindrone-ethinyl estradiol (LOESTRIN) 1-20 MG-MCG tablet, Take by mouth., Disp: , Rfl:   EXAM:  VITALS per patient if applicable:  GENERAL: alert, oriented, appears well and in no acute distress  HEENT: atraumatic, conjunttiva clear, no obvious abnormalities on inspection of external nose and ears  NECK: normal movements of the head and neck  LUNGS: on inspection no signs of respiratory distress, breathing rate appears normal, no obvious gross SOB, gasping or wheezing  CV: no obvious cyanosis  MS: moves all visible extremities without noticeable abnormality  PSYCH/NEURO: pleasant and cooperative, no obvious depression or anxiety, speech and thought processing grossly intact  ASSESSMENT AND PLAN:  Discussed the following assessment and plan:  Dizziness  Neck pain  Paresthesias  -we discussed possible serious and likely etiologies, options for evaluation and workup, limitations of telemedicine visit vs in person visit, treatment, treatment risks and precautions. Pt prefers to treat via telemedicine empirically rather than in person at this moment.  The differential for her symptoms is long, and some of the symptoms are quite concerning.  Recommended she needs an in person evaluation, given she has not improved and now is having paresthesias.  She did not have imaging, EKG or Covid testing her evaluation yesterday.  Likely needs all of these at minimum, pending a good in person neurological exam.  Discussed options.  She plans to go to the [facility], as she prefers to get urgent imaging done today and her  PCP office was unable to see her today.  She agrees to go now and reports she has someone to take her and does not feel the need for emergency transport.  Advised against driving given the dizziness.    Did let this patient know that I only do telemedicine on Tuesdays and Thursdays for [place]. Advised to schedule follow up visit with PCP or [facility] if any further questions or concerns to avoid delays in care.   I discussed the assessment and treatment plan with the patient. The patient was provided an opportunity to ask questions and all were answered. The patient agreed with the plan and demonstrated an understanding of the instructions.     Lucretia Kern, DO

## 2020-04-04 NOTE — ED Notes (Signed)
Returned from MRI 

## 2020-04-04 NOTE — ED Notes (Signed)
Pt presents to ED BIB [facility] from [facility]. Pt c/o same, see previous triage note

## 2020-04-04 NOTE — ED Triage Notes (Addendum)
Patient reports that she began having dizziness and has chronic neck pain that worsened along with tingling of both hands yesterday. Patient states she went to an UC. Today, the symptoms occurred again, but is now experiencing pain in the lower back that radiates into both legs. Patient states dizziness is constant ,but worse in the AM.  Patieant states she has had chronic pain for 10-15 years, but has worsened.

## 2020-04-04 NOTE — ED Provider Notes (Signed)
[facility] DEPT Provider Note   CSN: [id number] Arrival date & time: 04/04/20  1335     History Chief Complaint  Patient presents with  . Dizziness    Jessica Warner is a 44 y.o. female history of GERD, otherwise healthy.  Patient presents today for dizziness which she describes as a feeling of being off balance.  This began yesterday morning April 04, 2019 when she noticed it immediately after she woke up and attempted to get out of bed.  She denies that the room is spinning describes a vague off-balance and difficulty with gait.  She denies similar in the past.  She reports that she developed bilateral paresthesias to her hands yesterday as well.  Finally she reports chronic neck pain for the past 15 years which is a bilateral aching sensation constant nonradiating no aggravating or alleviating factors.  She reports she went to an urgent care yesterday and was prescribed meclizine for possible vertigo she has attempted the medication without improvement.  She then called her primary care doctor's office today and had a televisit and was encouraged to come to the ER for imaging and further work-up.  Patient denies fall/injury, fever/chills, headache, vision changes, difficulty speaking, neck stiffness, chest pain/shortness of breath, abdominal pain, nausea/vomiting, diarrhea or any additional concerns. HPI     Past Medical History:  Diagnosis Date  . GERD (gastroesophageal reflux disease)   . History of cesarean delivery 02/22/2018  . Status post repeat low transverse cesarean section 02/23/2018    Patient Active Problem List   Diagnosis Date Noted  . Status post repeat low transverse cesarean section 02/23/2018  . History of cesarean delivery 02/22/2018  . Abdominal pain, epigastric 03/14/2015    Past Surgical History:  Procedure Laterality Date  . CESAREAN SECTION    . CESAREAN SECTION N/A 02/23/2018   Procedure: REPEAT CESAREAN SECTION;   Surgeon: Janyth Contes, MD;  Location: [facility];  Service: Obstetrics;  Laterality: N/A;  RNFA  . UPPER GASTROINTESTINAL ENDOSCOPY    . WISDOM TOOTH EXTRACTION       OB History    Gravida  3   Para  2   Term  2   Preterm      AB  1   Living  2     SAB  1   TAB      Ectopic      Multiple  0   Live Births  2           Family History  Problem Relation Age of Onset  . Healthy Mother   . Healthy Father   . Healthy Maternal Grandmother   . Healthy Maternal Grandfather   . Healthy Paternal Grandmother   . Healthy Paternal Grandfather   . Colon cancer Neg Hx   . Esophageal cancer Neg Hx   . Pancreatic cancer Neg Hx   . Rectal cancer Neg Hx   . Stomach cancer Neg Hx   . Breast cancer Neg Hx     Social History   Tobacco Use  . Smoking status: Never Smoker  . Smokeless tobacco: Never Used  Vaping Use  . Vaping Use: Never used  Substance Use Topics  . Alcohol use: No    Alcohol/week: 0.0 standard drinks  . Drug use: No    Home Medications Prior to Admission medications   Medication Sig Start Date End Date Taking? Authorizing Provider  meclizine (ANTIVERT) 12.5 MG tablet Take 1 tablet (12.5 mg total)  by mouth 3 (three) times daily as needed for dizziness. 04/03/20   Jaynee Eagles, PA-C  norethindrone-ethinyl estradiol (LOESTRIN) 1-20 MG-MCG tablet Take by mouth. 03/20/20   [provider]    Allergies    Patient has no known allergies.  Review of Systems   Review of Systems Ten systems are reviewed and are negative for acute change except as noted in the HPI  Physical Exam Updated Vital Signs BP 114/86   Pulse 82   Temp 98.1 F (36.7 C) (Oral)   Resp 16   Ht 5\' 2"  (1.575 m)   Wt 49.9 kg   LMP 03/22/2020   SpO2 99%   BMI 20.12 kg/m   Physical Exam Constitutional:      General: She is not in acute distress.    Appearance: Normal appearance. She is well-developed. She is not ill-appearing or diaphoretic.  HENT:      Head: Normocephalic and atraumatic.     Jaw: There is normal jaw occlusion.     Right Ear: Tympanic membrane normal.     Left Ear: Tympanic membrane normal.     Nose: Nose normal.     Mouth/Throat:     Mouth: Mucous membranes are moist.     Pharynx: Oropharynx is clear.  Eyes:     General: Vision grossly intact. Gaze aligned appropriately.     Extraocular Movements: Extraocular movements intact.     Conjunctiva/sclera: Conjunctivae normal.     Pupils: Pupils are equal, round, and reactive to light.  Neck:     Trachea: Trachea and phonation normal. No tracheal tenderness or tracheal deviation.     Meningeal: Brudzinski's sign absent.  Cardiovascular:     Rate and Rhythm: Normal rate and regular rhythm.     Pulses:          Dorsalis pedis pulses are 2+ on the right side and 2+ on the left side.  Pulmonary:     Effort: Pulmonary effort is normal. No respiratory distress.     Breath sounds: Normal breath sounds and air entry.  Abdominal:     General: There is no distension.     Palpations: Abdomen is soft. There is no pulsatile mass.     Tenderness: There is no abdominal tenderness. There is no guarding or rebound.  Musculoskeletal:        General: Normal range of motion.     Cervical back: Normal range of motion and neck supple.     Comments: No midline C/T/L spinal tenderness to palpation, no deformity, crepitus, or step-off noted. No sign of injury to the neck or back.  Bilateral paraspinal cervical muscle tenderness to palpation without overlying skin change or evidence of injury.   Feet:     Right foot:     Protective Sensation: 3 sites tested. 3 sites sensed.     Left foot:     Protective Sensation: 3 sites tested. 3 sites sensed.  Skin:    General: Skin is warm and dry.  Neurological:     Mental Status: She is alert.     GCS: GCS eye subscore is 4. GCS verbal subscore is 5. GCS motor subscore is 6.     Comments: Mental Status: Alert, oriented, thought content  appropriate, able to give a coherent history. Speech fluent without evidence of aphasia. Able to follow 2 step commands without difficulty. Cranial Nerves: II: Peripheral visual fields grossly normal, pupils equal, round, reactive to light III,IV, VI: ptosis not present, extra-ocular motions intact  bilaterally V,VII: smile symmetric, eyebrows raise symmetric, facial light touch sensation equal VIII: hearing grossly normal to voice X: uvula elevates symmetrically XI: bilateral shoulder shrug symmetric and strong XII: midline tongue extension without fassiculations Motor: Normal tone. 5/5 strength in upper and lower extremities bilaterally including strong and equal grip strength and dorsiflexion/plantar flexion Sensory: Sensation intact to light touch in all extremities.  ROMBERG: Patient fell slowly forward, required assistance to avoid fall.  DTR: No clonus of the feet. Cerebellar: normal finger-to-nose with bilateral upper extremities. Normal heel-to -shin balance bilaterally of the lower extremity. No pronator drift.  Gait: normal gait and balance CV: distal pulses palpable throughout  Psychiatric:        Behavior: Behavior normal.     ED Results / Procedures / Treatments   Labs (all labs ordered are listed, but only abnormal results are displayed) Labs Reviewed  CBC WITH DIFFERENTIAL/PLATELET  COMPREHENSIVE METABOLIC PANEL  I-STAT BETA HCG BLOOD, ED ([facility], [facility], [facility] ONLY)  TROPONIN I (HIGH SENSITIVITY)    EKG EKG Interpretation  Date/Time:  Tuesday April 04 2020 17:32:09 EDT Ventricular Rate:  70 PR Interval:    QRS Duration: 90 QT Interval:  377 QTC Calculation: 407 R Axis:   89 Text Interpretation: Sinus rhythm Short PR interval No old tracing to compare Confirmed by Aletta Edouard [phone]) on 04/04/2020 5:37:50 PM    Radiology No results found.  Procedures Procedures (including critical care time)  Medications Ordered in ED Medications  sodium chloride  0.9 % bolus 500 mL (has no administration in time range)    ED Course  I have reviewed the triage vital signs and the nursing notes.  Pertinent labs & imaging results that were available during my care of the patient were reviewed by me and considered in my medical decision making (see chart for details).  Clinical Course as of Apr 04 2246  Tue Apr 04, 2020  2241 Dr. Rory Percy   [BM]  8921 Dr. Roxanne Mins   [BM]    Clinical Course User Index [BM] Gari Crown   MDM Rules/Calculators/A&P                         Additional history obtained from: 1. Nursing notes from this visit. 2. Electronic medical record reviewed.  Patient seen at urgent care yesterday, was prescribed meclizine 12.5 mg p.o. 3 times daily as needed.  She had CBC which was within normal limits and was given carbamide peroxide for cerumen lavage.  Reviewed patient's video visit with her PCP today was encouraged to have in person exam today. --------------------------------------- 44 year old female with history as above presented for a vague dizzy sensation which she does not describe as room spinning that started yesterday morning she woke up with it it is somewhat worsened with position but she has no nystagmus on examination, no skew.  She denies any chest pain, syncope or shortness of breath, no vision changes.  She has no meningismus on exam and cranial nerves are intact.  Neuro examination is within normal limits aside from a positive Romberg test.  She is endorsing some paresthesias to her hands as well.  Will obtain CBC, CMP, troponin, beta hCG, EKG and CT angio head/neck for assessment of vasculature given neck pain with dizziness.  Discussed case with Dr. Melina Copa who agrees. ------------------------ ------------------------ I ordered, reviewed and interpreted labs which include: CBC without anemia or leukocytosis. CMP shows no emergent electrolyte derangement, AKI, LFT elevations or gap.  High-sensitivity  troponin within normal limits. Pregnancy test negative. Orthostatics negative  EKG: Sinus rhythm Short PR interval No old tracing to compare Confirmed by Aletta Edouard [phone]) on 04/04/2020 5:37:50 PM  CT Angio Head/Neck:    IMPRESSION:  No acute intracranial abnormality.    No large vessel occlusion, hemodynamically significant stenosis, or  evidence of dissection.  ----------------------------- Patient reassessed, reports continued dizziness remains unstable with ambulation.  Rediscussed case with Dr. Melina Copa, will obtain MRI brain without contrast. ----------------------------- Patient went to MRI, during her scan the MRI machine broke down.  MRI Brain:  IMPRESSION:  1. Technically limited exam with only diffusion weighted and SWI  sequences performed.  2. No acute intracranial infarct identified. No other definite acute  intracranial abnormality.   Patient was reassessed, resting comfortably no acute distress, improved Romberg exam.  Consult placed to neurology. - 10:41 PM: Discussed case with neurologist Dr. Rory Percy, recommends obtaining MRI brain/cervical spine with and without contrast for evaluation of possible MS.  If negative patient may need more fluids, possibly benzos. - Patient reassessed resting comfortably no acute distress reports dizziness continues, she is agreeable to transfer to [facility] for MRI. - 10:46 PM: Discussed case with Dr. Roxanne Mins, patient accepted in transfer.  Note: Portions of this report may have been transcribed using voice recognition software. Every effort was made to ensure accuracy; however, inadvertent computerized transcription errors may still be present. Final Clinical Impression(s) / ED Diagnoses Final diagnoses:  None    Rx / DC Orders ED Discharge Orders    None       Gari Crown 04/04/20 2251    Hayden Rasmussen, MD 04/05/20 1053

## 2020-04-04 NOTE — ED Notes (Signed)
Departed via [facility] @ this time

## 2020-04-05 ENCOUNTER — Emergency Department (HOSPITAL_COMMUNITY): Payer: 59

## 2020-04-05 DIAGNOSIS — M542 Cervicalgia: Secondary | ICD-10-CM | POA: Diagnosis not present

## 2020-04-05 DIAGNOSIS — R110 Nausea: Secondary | ICD-10-CM | POA: Diagnosis not present

## 2020-04-05 DIAGNOSIS — M4802 Spinal stenosis, cervical region: Secondary | ICD-10-CM | POA: Diagnosis not present

## 2020-04-05 DIAGNOSIS — G8929 Other chronic pain: Secondary | ICD-10-CM | POA: Diagnosis not present

## 2020-04-05 DIAGNOSIS — R202 Paresthesia of skin: Secondary | ICD-10-CM | POA: Diagnosis not present

## 2020-04-05 DIAGNOSIS — R200 Anesthesia of skin: Secondary | ICD-10-CM | POA: Diagnosis not present

## 2020-04-05 DIAGNOSIS — D1809 Hemangioma of other sites: Secondary | ICD-10-CM | POA: Diagnosis not present

## 2020-04-05 DIAGNOSIS — R42 Dizziness and giddiness: Secondary | ICD-10-CM | POA: Diagnosis not present

## 2020-04-05 MED ORDER — GADOBUTROL 1 MMOL/ML IV SOLN
5.0000 mL | Freq: Once | INTRAVENOUS | Status: AC | PRN
  Administered 2020-04-05: 5 mL via INTRAVENOUS

## 2020-04-05 MED ORDER — DIAZEPAM 2 MG PO TABS: 2.0000 mg | ORAL_TABLET | Freq: Four times a day (QID) | ORAL | 0 refills | Status: DC | PRN

## 2020-04-05 NOTE — ED Provider Notes (Signed)
44 yo M with a chief complaint of dizziness.  Feels unsteady.  Going on for about 48 hours now.  Was seen at [facility] emergency department and had an MRI that did not show any obvious pathology though it was felt she needed an MRI with and without contrast in the MRI machine broke while she was in it and she was transferred here for imaging.  She feels mildly better now.  Has had some nausea at times but none currently.  Denies headache.  Has chronic neck pain but is worse over the past couple days.  MRI with nonspecific findings.  I did discuss it with Dr. Rory Percy.  He felt she needed no further acute work-up.  He suggested she follow-up with [facility] neurology with Dr. Felecia Shelling for the MRI findings.   Deno Etienne, DO 04/05/20 [phone]

## 2020-04-05 NOTE — ED Notes (Signed)
Too mri

## 2020-04-05 NOTE — Discharge Instructions (Addendum)
Your MRI did not find an acute finding.  It was not normal.  I discussed it with the neurologist and he recommended following up as an outpatient.  I have put an order into the system they should call you on the phone to set up an appointment.  He recommended a trial of Valium tablets to try and help you with your dizziness if the meclizine is not improving.

## 2020-04-05 NOTE — ED Notes (Signed)
Patient verbalizes understanding of discharge instructions. Opportunity for questioning and answers were provided. Armband removed by staff, pt discharged from ED  And ambulated to lobby to return home.   

## 2020-04-06 ENCOUNTER — Ambulatory Visit (INDEPENDENT_AMBULATORY_CARE_PROVIDER_SITE_OTHER): Payer: 59 | Admitting: Neurology

## 2020-04-06 ENCOUNTER — Encounter: Payer: Self-pay | Admitting: Neurology

## 2020-04-06 VITALS — BP 108/77 | HR 92 | Ht 62.0 in | Wt 110.5 lb

## 2020-04-06 DIAGNOSIS — R9082 White matter disease, unspecified: Secondary | ICD-10-CM | POA: Diagnosis not present

## 2020-04-06 DIAGNOSIS — R42 Dizziness and giddiness: Secondary | ICD-10-CM

## 2020-04-06 MED ORDER — DIAZEPAM 2 MG PO TABS
ORAL_TABLET | ORAL | 0 refills | Status: DC
Start: 2020-04-06 — End: 2021-12-04

## 2020-04-06 MED ORDER — METHYLPREDNISOLONE 4 MG PO TABS: ORAL_TABLET | ORAL | 0 refills | Status: DC

## 2020-04-06 NOTE — Progress Notes (Signed)
[facility]  PATIENT: Jessica Warner DOB: 04-18-1976  REFERRING DOCTOR OR PCP: Billey Gosling, MD SOURCE: Patient, notes from primary care, notes from emergency room, imaging and lab reports, MRI images personally reviewed.  _________________________________   HISTORICAL  CHIEF COMPLAINT:  Chief Complaint  Patient presents with  . New Patient (Initial Visit)    RM 12, alone. 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 ED referral for dizziness. She went there on 04/04/20. She is feeling dizzy still. When she turns head, she gets more dizzy. Wears glasses. Reports dizziness constant, not positional. Dizziness the worst in the mornings. Sometimes causes her to fall back while walking.    HISTORY OF PRESENT ILLNESS:  I had the pleasure of seeing patient, Jessica Warner, at [facility] for neurologic consultation regarding her vertigo.  She is a 44 year old woman who had the onset of severe vertigo earlier this week.   When she woke up Monday morning (04/02/2020) she had vertigo with a rotational quality.  This was worse when she tried to get out of bed and she needed many minutes before she felt safe enough to walk she has continued to experience the vertigo..   The dizziness is constant at a low level but is worse when she is moving.     Her vertigo is worse in the morning and generally better later in the day..   She feels she needs to wait a few minutes before she is stable enough to move when she gets up in the morning.  Later in the day, she can start walking when she stands up but still feels dizzy.  She has nausea without vomiting.  Changes in position like rolling over, turning her head rapidly or bending over getting back up will cause the vertigo to increase for a couple minutes.  She is slightly worse today than she was on Monday with more middle of the day dizziness.    She denied any change in hearing or a stuffed up sensation in her  ears.    She has a h/o of orthostatic hypotension and has felt presyncope but no syncope   This usually lasts seconds only and is completely different than current symptoms.    There was no fever, headache or tinnitus.  She has chronic left sided neck pain x years, but it has been worse the past week or so.  On 04/03/2020, she went to the emergency room.  She was noted to have cerumen in the ear canals and ear lavage was performed.  She was placed on meclizine and discharged.  She returned to the ER 04/04/2020.  An MRI of the brain was performed which showed scattered T2/FLAIR foci in the subcortical and deep white matter of the hemispheres.  There were no acute findings.  CT angiogram was performed that showed no dissection or vertebrobasilar insufficiency.  Valium was prescribed but she has not yet taken this.  Vascular risks:  She does not smoke.   No known HTN or DM.       I personally reviewed the MRI of the brain and a CT angiogram.  The MRI of the brain shows at least a dozen T2/flair hyperintense foci in the subcortical and deep white matter.  These are small and round and most consistent with chronic microvascular ischemic change.  The pattern is not typical for demyelination.  Sequela of migraine or cardio emboli could also have this pattern.   The CT angiogram was essentially  normal.  REVIEW OF SYSTEMS: Constitutional: No fevers, chills, sweats, or change in appetite Eyes: No visual changes, double vision, eye pain Ear, nose and throat: As above cardiovascular: No chest pain, palpitations Respiratory: No shortness of breath at rest or with exertion.   No wheezes GastrointestinaI: No nausea, vomiting, diarrhea, abdominal pain, fecal incontinence Genitourinary: No dysuria, urinary retention or frequency.  No nocturia. Musculoskeletal:Reports neck pain, no back pain Integumentary: No rash, pruritus, skin lesions Neurological: as above Psychiatric: No depression at this time.  No  anxiety Endocrine: No palpitations, diaphoresis, change in appetite, change in weigh or increased thirst Hematologic/Lymphatic: No anemia, purpura, petechiae. Allergic/Immunologic: No itchy/runny eyes, nasal congestion, recent allergic reactions, rashes  ALLERGIES: No Known Allergies  HOME MEDICATIONS:  Current Outpatient Medications:  .  diazepam (VALIUM) 2 MG tablet, Take 1 tablet (2 mg total) by mouth every 6 (six) hours as needed for anxiety (spasms)., Disp: 10 tablet, Rfl: 0 .  meclizine (ANTIVERT) 12.5 MG tablet, Take 1 tablet (12.5 mg total) by mouth 3 (three) times daily as needed for dizziness., Disp: 30 tablet, Rfl: 0 .  norethindrone-ethinyl estradiol (LOESTRIN) 1-20 MG-MCG tablet, Take by mouth., Disp: , Rfl:   PAST MEDICAL HISTORY: Past Medical History:  Diagnosis Date  . GERD (gastroesophageal reflux disease)   . History of cesarean delivery 02/22/2018  . Status post repeat low transverse cesarean section 02/23/2018    PAST SURGICAL HISTORY: Past Surgical History:  Procedure Laterality Date  . CESAREAN SECTION    . CESAREAN SECTION N/A 02/23/2018   Procedure: REPEAT CESAREAN SECTION;  Surgeon: Janyth Contes, MD;  Location: [place];  Service: Obstetrics;  Laterality: N/A;  RNFA  . UPPER GASTROINTESTINAL ENDOSCOPY    . WISDOM TOOTH EXTRACTION      FAMILY HISTORY: Family History  Problem Relation Age of Onset  . Healthy Mother   . Healthy Father   . Healthy Maternal Grandmother   . Healthy Maternal Grandfather   . Healthy Paternal Grandmother   . Healthy Paternal Grandfather   . Colon cancer Neg Hx   . Esophageal cancer Neg Hx   . Pancreatic cancer Neg Hx   . Rectal cancer Neg Hx   . Stomach cancer Neg Hx   . Breast cancer Neg Hx     SOCIAL HISTORY:  Social History   Socioeconomic History  . Marital status: Married    Spouse name: Not on file  . Number of children: 2  . Years of education: 75  . Highest education level: Not on  file  Occupational History  . Occupation: Marketing executive for [employer]  Tobacco Use  . Smoking status: Never Smoker  . Smokeless tobacco: Never Used  Vaping Use  . Vaping Use: Never used  Substance and Sexual Activity  . Alcohol use: No    Alcohol/week: 0.0 standard drinks  . Drug use: No  . Sexual activity: Not on file  Other Topics Concern  . Not on file  Social History Narrative   Right handed   Fun: Reading, cooking, walking.   Denies religious beliefs effecting health care.       caffeine use: rare   Social Determinants of Health   Financial Resource Strain:   . Difficulty of Paying Living Expenses: Not on file  Food Insecurity:   . Worried About Charity fundraiser in the Last Year: Not on file  . Ran Out of Food in the Last Year: Not on file  Transportation Needs:   . Lack  of Transportation (Medical): Not on file  . Lack of Transportation (Non-Medical): Not on file  Physical Activity:   . Days of Exercise per Week: Not on file  . Minutes of Exercise per Session: Not on file  Stress:   . Feeling of Stress : Not on file  Social Connections:   . Frequency of Communication with Friends and Family: Not on file  . Frequency of Social Gatherings with Friends and Family: Not on file  . Attends Religious Services: Not on file  . Active Member of Clubs or Organizations: Not on file  . Attends Archivist Meetings: Not on file  . Marital Status: Not on file  Intimate Partner Violence:   . Fear of Current or Ex-Partner: Not on file  . Emotionally Abused: Not on file  . Physically Abused: Not on file  . Sexually Abused: Not on file     PHYSICAL EXAM  Vitals:   04/06/20 1500  BP: 108/77  Pulse: 92  Weight: 110 lb 8 oz (50.1 kg)  Height: 5\' 2"  (1.575 m)    Body mass index is 20.21 kg/m.   General: The patient is well-developed and well-nourished and in no acute distress  HEENT:  Head is Dixie/AT.  Sclera are anicteric.  Funduscopic exam shows  normal optic discs and retinal vessels.  She has some cerumen in the ear canals bilaterally but the tympanic membranes are visible and appear intact.  Neck: No carotid bruits are noted.  The neck is nontender.  Cardiovascular: The heart has a regular rate and rhythm with a normal S1 and S2. There were no murmurs, gallops or rubs.    Skin: Extremities are without rash or  edema.  Musculoskeletal:  Back is nontender  Neurologic Exam  Mental status: The patient is alert and oriented x 3 at the time of the examination. The patient has apparent normal recent and remote memory, with an apparently normal attention span and concentration ability.   Speech is normal.  Cranial nerves: Extraocular movements are full. Pupils are equal, round, and reactive to light and accomodation.  Visual fields are full.  Facial symmetry is present. There is good facial sensation to soft touch bilaterally.Facial strength is normal.  Trapezius and sternocleidomastoid strength is normal. No dysarthria is noted.  The tongue is midline, and the patient has symmetric elevation of the soft palate. No obvious hearing deficits are noted.  Motor:  Muscle bulk is normal.   Tone is normal. Strength is  5 / 5 in all 4 extremities.   Sensory: Sensory testing is intact to pinprick, soft touch and vibration sensation in all 4 extremities.  Coordination: Cerebellar testing reveals good finger-nose-finger and heel-to-shin bilaterally.  Gait and station: Station is normal.   Gait is normal. Tandem gait is normal. Romberg is negative.   Reflexes: Deep tendon reflexes are symmetric and normal bilaterally.     Dix-Hallpike maneuver was performed to either side.  This did not evoke nystagmus nor significant vertigo.  She had mild vertigo upon sitting back up but no nystagmus.     DIAGNOSTIC DATA (LABS, IMAGING, TESTING) - I reviewed patient records, labs, notes, testing and imaging myself where available.  Lab Results  Component  Value Date   WBC 7.4 04/04/2020   HGB 15.1 (H) 04/04/2020   HCT 45.3 04/04/2020   MCV 92.4 04/04/2020   PLT 276 04/04/2020      Component Value Date/Time   NA 138 04/04/2020 1733   K 3.9 04/04/2020  1733   CL 103 04/04/2020 1733   CO2 26 04/04/2020 1733   GLUCOSE 104 (H) 04/04/2020 1733   BUN 15 04/04/2020 1733   CREATININE 0.75 04/04/2020 1733   CALCIUM 9.1 04/04/2020 1733   PROT 7.5 04/04/2020 1733   ALBUMIN 4.5 04/04/2020 1733   AST 22 04/04/2020 1733   ALT 27 04/04/2020 1733   ALKPHOS 56 04/04/2020 1733   BILITOT 0.5 04/04/2020 1733   GFRNONAA >60 04/04/2020 1733   Lab Results  Component Value Date   CHOL 174 09/12/2016   HDL 61.00 09/12/2016   LDLCALC 89 09/12/2016   TRIG 117.0 09/12/2016   CHOLHDL 3 09/12/2016    Lab Results  Component Value Date   TSH 1.37 09/12/2016       ASSESSMENT AND PLAN  Vertigo - Plan: ECHOCARDIOGRAM COMPLETE BUBBLE STUDY, Sedimentation rate, C-reactive protein, ANA w/Reflex, Pan-ANCA  White matter abnormality on MRI of brain - Plan: ECHOCARDIOGRAM COMPLETE BUBBLE STUDY, Sedimentation rate, C-reactive protein, ANA w/Reflex, Pan-ANCA   In summary, Ms. Hoes is a 44 year old woman with the onset of vertigo earlier this week accompanied by a worsening of her neck pain.  CT angiogram did not show any evidence of dissection or vertebrobasilar insufficiency.  MRI showed nonspecific foci dominantly in the subcortical white matter.  There were no acute findings.  The internal auditory canals appear normal.  The etiology of her symptoms is not apparent.  I discussed with her that the MRI of the brain is a little abnormal for her age though most likely the MRI changes just represent mild chronic microvascular ischemic change.  Because the majority of the foci are subcortical we need to check a bubble contrasted echocardiogram to make sure that she does not have a patent foramen ovale or other source of cardio emboli.  I will also check some blood  work for vasculitis.  I have placed her on a Medrol Dosepak and have advised her to go ahead and fill with the Valium prescription.  She will return to see me in about 3 weeks or sooner if there are new or worsening neurologic symptoms.  Thank you for asking me to see Ms. Wunder.  Please let her know plan of a further assistance with her or other patients in the future.  Medrol dosepak Echo bubble contrast labs   Jordyne Poehlman A. Felecia Shelling, MD, [facility] 52/0/8022, 3:36 PM Certified in Neurology, Clinical Neurophysiology, Sleep Medicine and Neuroimaging  [facility] [address] [place], [place] [address] [phone]

## 2020-04-07 ENCOUNTER — Ambulatory Visit: Payer: 59 | Admitting: Internal Medicine

## 2020-04-07 LAB — PAN-ANCA
ANCA Proteinase 3: <3.5 U/mL (ref 0.0–3.5)
Atypical pANCA: <1:20 {titer}
C-ANCA: <1:20 {titer}
Myeloperoxidase Ab: <9 U/mL (ref 0.0–9.0)
P-ANCA: <1:20 {titer}

## 2020-04-07 LAB — SEDIMENTATION RATE: Sed Rate: 2 mm/hr (ref 0–32)

## 2020-04-07 LAB — ANA W/REFLEX: Anti Nuclear Antibody (ANA): NEGATIVE

## 2020-04-07 LAB — C-REACTIVE PROTEIN: CRP: 3 mg/L (ref 0–10)

## 2020-04-24 ENCOUNTER — Ambulatory Visit: Payer: 59 | Admitting: Neurology

## 2020-04-27 ENCOUNTER — Other Ambulatory Visit: Payer: Self-pay

## 2020-04-27 ENCOUNTER — Ambulatory Visit (HOSPITAL_COMMUNITY): Payer: 59 | Attending: Cardiovascular Disease

## 2020-04-27 DIAGNOSIS — R9082 White matter disease, unspecified: Secondary | ICD-10-CM

## 2020-04-27 DIAGNOSIS — R42 Dizziness and giddiness: Secondary | ICD-10-CM

## 2020-04-27 LAB — ECHOCARDIOGRAM COMPLETE BUBBLE STUDY
Area-P 1/2: 3.36 cm2
S' Lateral: 2.5 cm

## 2020-04-27 MED ORDER — SODIUM CHLORIDE 0.9% FLUSH
10.0000 mL | INTRAVENOUS | Status: DC | PRN
  Administered 2020-04-27: 10 mL via INTRAVENOUS

## 2020-05-01 ENCOUNTER — Ambulatory Visit: Payer: 59 | Admitting: Neurology

## 2020-05-04 ENCOUNTER — Ambulatory Visit: Payer: 59 | Admitting: Gastroenterology

## 2020-06-12 ENCOUNTER — Ambulatory Visit: Payer: 59 | Admitting: Neurology

## 2020-06-15 ENCOUNTER — Other Ambulatory Visit (HOSPITAL_COMMUNITY): Payer: Self-pay | Admitting: Obstetrics and Gynecology

## 2020-06-15 DIAGNOSIS — Z01419 Encounter for gynecological examination (general) (routine) without abnormal findings: Secondary | ICD-10-CM | POA: Diagnosis not present

## 2020-06-15 DIAGNOSIS — Z130 Encounter for screening for diseases of the blood and blood-forming organs and certain disorders involving the immune mechanism: Secondary | ICD-10-CM | POA: Diagnosis not present

## 2020-06-15 DIAGNOSIS — Z3041 Encounter for surveillance of contraceptive pills: Secondary | ICD-10-CM | POA: Diagnosis not present

## 2020-06-15 DIAGNOSIS — Z1151 Encounter for screening for human papillomavirus (HPV): Secondary | ICD-10-CM | POA: Diagnosis not present

## 2020-06-15 DIAGNOSIS — M791 Myalgia, unspecified site: Secondary | ICD-10-CM | POA: Diagnosis not present

## 2020-06-15 DIAGNOSIS — Z124 Encounter for screening for malignant neoplasm of cervix: Secondary | ICD-10-CM | POA: Diagnosis not present

## 2020-06-15 DIAGNOSIS — Z1231 Encounter for screening mammogram for malignant neoplasm of breast: Secondary | ICD-10-CM | POA: Diagnosis not present

## 2020-06-15 DIAGNOSIS — Z6820 Body mass index (BMI) 20.0-20.9, adult: Secondary | ICD-10-CM | POA: Diagnosis not present

## 2020-06-16 ENCOUNTER — Ambulatory Visit: Payer: 59 | Attending: Internal Medicine

## 2020-06-16 DIAGNOSIS — Z23 Encounter for immunization: Secondary | ICD-10-CM

## 2020-06-16 NOTE — Progress Notes (Signed)
   Covid-19 Vaccination Clinic  Name:  Jessica Warner    MRN: [id number] DOB: December 09, 1975  06/16/2020  Jessica Warner was observed post Covid-19 immunization for 15 minutes without incident. She was provided with Vaccine Information Sheet and instruction to access the V-Safe system.   Jessica Warner was instructed to call 911 with any severe reactions post vaccine: Marland Kitchen Difficulty breathing  . Swelling of face and throat  . A fast heartbeat  . A bad rash all over body  . Dizziness and weakness   Immunizations Administered    Name Date Dose VIS Date Route   Moderna Covid-19 Booster Vaccine 06/16/2020  4:36 PM 0.25 mL 04/19/2020 Intramuscular   Manufacturer: Moderna   Lot: [id number]   [place]: [id number]

## 2020-11-28 ENCOUNTER — Other Ambulatory Visit (HOSPITAL_COMMUNITY): Payer: Self-pay

## 2020-11-28 MED FILL — Norethindrone Ace & Ethinyl Estradiol Tab 1 MG-20 MCG: ORAL | 63 days supply | Qty: 63 | Fill #0 | Status: AC

## 2020-11-29 ENCOUNTER — Other Ambulatory Visit (HOSPITAL_COMMUNITY): Payer: Self-pay

## 2021-02-27 ENCOUNTER — Other Ambulatory Visit (HOSPITAL_COMMUNITY): Payer: Self-pay

## 2021-02-27 MED FILL — Norethindrone Ace & Ethinyl Estradiol Tab 1 MG-20 MCG: ORAL | 63 days supply | Qty: 63 | Fill #1 | Status: AC

## 2021-05-14 ENCOUNTER — Other Ambulatory Visit (HOSPITAL_COMMUNITY): Payer: Self-pay

## 2021-05-14 MED FILL — Norethindrone Ace & Ethinyl Estradiol Tab 1 MG-20 MCG: ORAL | 63 days supply | Qty: 63 | Fill #2 | Status: AC

## 2021-06-04 ENCOUNTER — Other Ambulatory Visit (HOSPITAL_COMMUNITY): Payer: Self-pay

## 2021-06-04 MED ORDER — PAXLOVID (300/100) 20 X 150 MG & 10 X 100MG PO TBPK
ORAL_TABLET | ORAL | 0 refills | Status: DC
  Filled 2021-06-04: qty 30, 5d supply, fill #0

## 2021-08-15 ENCOUNTER — Other Ambulatory Visit: Payer: Self-pay

## 2021-08-15 ENCOUNTER — Other Ambulatory Visit (HOSPITAL_COMMUNITY): Payer: Self-pay

## 2021-08-17 ENCOUNTER — Other Ambulatory Visit (HOSPITAL_COMMUNITY): Payer: Self-pay

## 2021-08-20 ENCOUNTER — Other Ambulatory Visit (HOSPITAL_COMMUNITY): Payer: Self-pay

## 2021-08-23 ENCOUNTER — Other Ambulatory Visit (HOSPITAL_COMMUNITY): Payer: Self-pay

## 2021-08-27 ENCOUNTER — Other Ambulatory Visit (HOSPITAL_COMMUNITY): Payer: Self-pay

## 2021-08-27 DIAGNOSIS — Z6820 Body mass index (BMI) 20.0-20.9, adult: Secondary | ICD-10-CM | POA: Diagnosis not present

## 2021-08-27 DIAGNOSIS — Z3041 Encounter for surveillance of contraceptive pills: Secondary | ICD-10-CM | POA: Diagnosis not present

## 2021-08-27 DIAGNOSIS — K649 Unspecified hemorrhoids: Secondary | ICD-10-CM | POA: Diagnosis not present

## 2021-08-27 DIAGNOSIS — Z124 Encounter for screening for malignant neoplasm of cervix: Secondary | ICD-10-CM | POA: Diagnosis not present

## 2021-08-27 DIAGNOSIS — Z01419 Encounter for gynecological examination (general) (routine) without abnormal findings: Secondary | ICD-10-CM | POA: Diagnosis not present

## 2021-08-27 DIAGNOSIS — Z130 Encounter for screening for diseases of the blood and blood-forming organs and certain disorders involving the immune mechanism: Secondary | ICD-10-CM | POA: Diagnosis not present

## 2021-08-27 DIAGNOSIS — Z1151 Encounter for screening for human papillomavirus (HPV): Secondary | ICD-10-CM | POA: Diagnosis not present

## 2021-08-27 DIAGNOSIS — Z1211 Encounter for screening for malignant neoplasm of colon: Secondary | ICD-10-CM | POA: Diagnosis not present

## 2021-08-27 MED ORDER — NORETHINDRONE ACET-ETHINYL EST 1-20 MG-MCG PO TABS
1.0000 | ORAL_TABLET | Freq: Every day | ORAL | 5 refills | Status: DC
  Filled 2021-08-27: qty 63, 84d supply, fill #0
  Filled 2021-11-05: qty 63, 84d supply, fill #1
  Filled 2022-01-21: qty 63, 84d supply, fill #2
  Filled 2022-03-25 – 2022-03-29 (×2): qty 63, 84d supply, fill #3
  Filled 2022-06-12 – 2022-06-21 (×2): qty 63, 84d supply, fill #4

## 2021-08-27 MED ORDER — PROCTOFOAM HC 1-1 % EX FOAM
CUTANEOUS | 0 refills | Status: DC
  Filled 2021-08-27: qty 10, 20d supply, fill #0

## 2021-08-28 DIAGNOSIS — Z1151 Encounter for screening for human papillomavirus (HPV): Secondary | ICD-10-CM | POA: Diagnosis not present

## 2021-08-28 DIAGNOSIS — Z124 Encounter for screening for malignant neoplasm of cervix: Secondary | ICD-10-CM | POA: Diagnosis not present

## 2021-08-29 ENCOUNTER — Other Ambulatory Visit (HOSPITAL_COMMUNITY): Payer: Self-pay

## 2021-08-29 MED ORDER — HYDROCORTISONE (PERIANAL) 2.5 % EX CREA
TOPICAL_CREAM | CUTANEOUS | 0 refills | Status: DC
  Filled 2021-08-29: qty 30, 10d supply, fill #0

## 2021-11-05 ENCOUNTER — Other Ambulatory Visit (HOSPITAL_COMMUNITY): Payer: Self-pay

## 2021-12-03 NOTE — Progress Notes (Unsigned)
    Subjective:    CC: Neck and shoulder pain  I, Molly Weber, LAT, ATC, am serving as scribe for Dr. Lynne Leader.  HPI: Pt is a 46 y/o female presenting w/ c/o neck and shoulder pain x .  She locates her pain to .  Radiating pain: UE paresthesias: Aggravating factors: Treatments tried:  Diagnostic testing: C-spine MRI- 04/05/20  Pertinent review of Systems: ***  Relevant historical information: ***   Objective:   There were no vitals filed for this visit. General: Well Developed, well nourished, and in no acute distress.   MSK: ***  Lab and Radiology Results No results found for this or any previous visit (from the past 72 hour(s)). No results found.    Impression and Recommendations:    Assessment and Plan: 46 y.o. female with ***.  PDMP not reviewed this encounter. No orders of the defined types were placed in this encounter.  No orders of the defined types were placed in this encounter.   Discussed warning signs or symptoms. Please see discharge instructions. Patient expresses understanding.   ***

## 2021-12-04 ENCOUNTER — Ambulatory Visit (INDEPENDENT_AMBULATORY_CARE_PROVIDER_SITE_OTHER): Payer: 59 | Admitting: Family Medicine

## 2021-12-04 ENCOUNTER — Other Ambulatory Visit (HOSPITAL_COMMUNITY): Payer: Self-pay

## 2021-12-04 VITALS — BP 112/74 | HR 71 | Ht 62.0 in | Wt 112.6 lb

## 2021-12-04 DIAGNOSIS — G8929 Other chronic pain: Secondary | ICD-10-CM | POA: Diagnosis not present

## 2021-12-04 DIAGNOSIS — M542 Cervicalgia: Secondary | ICD-10-CM

## 2021-12-04 MED ORDER — TIZANIDINE HCL 4 MG PO TABS
4.0000 mg | ORAL_TABLET | Freq: Three times a day (TID) | ORAL | 1 refills | Status: DC | PRN
  Filled 2021-12-04: qty 60, 20d supply, fill #0

## 2021-12-04 NOTE — Patient Instructions (Addendum)
Thank you for coming in today.   I've referred you to Physical Therapy.  Let us know if you don't hear from them in one week.   Try using a heating pad  Check back in 8 weeks.  TENS UNIT: This is helpful for muscle pain and spasm.   Search and Purchase a TENS 7000 2nd edition at  www.tenspros.com or www.[employer].com It should be less than $30.     TENS unit instructions: Do not shower or bathe with the unit on Turn the unit off before removing electrodes or batteries If the electrodes lose stickiness add a drop of water to the electrodes after they are disconnected from the unit and place on plastic sheet. If you continued to have difficulty, call the TENS unit company to purchase more electrodes. Do not apply lotion on the skin area prior to use. Make sure the skin is clean and dry as this will help prolong the life of the electrodes. After use, always check skin for unusual red areas, rash or other skin difficulties. If there are any skin problems, does not apply electrodes to the same area. Never remove the electrodes from the unit by pulling the wires. Do not use the TENS unit or electrodes other than as directed. Do not change electrode placement without consultating your therapist or physician. Keep 2 fingers with between each electrode. Wear time ratio is 2:1, on to off times.    For example on for 30 minutes off for 15 minutes and then on for 30 minutes off for 15 minutes

## 2021-12-05 ENCOUNTER — Other Ambulatory Visit (HOSPITAL_COMMUNITY): Payer: Self-pay

## 2021-12-10 ENCOUNTER — Ambulatory Visit (INDEPENDENT_AMBULATORY_CARE_PROVIDER_SITE_OTHER): Payer: 59 | Admitting: Physical Therapy

## 2021-12-10 ENCOUNTER — Encounter: Payer: Self-pay | Admitting: Physical Therapy

## 2021-12-10 DIAGNOSIS — M542 Cervicalgia: Secondary | ICD-10-CM | POA: Diagnosis not present

## 2021-12-10 DIAGNOSIS — M62838 Other muscle spasm: Secondary | ICD-10-CM

## 2021-12-10 NOTE — Therapy (Unsigned)
OUTPATIENT PHYSICAL THERAPY EVALUATION   Patient Name: Jessica Warner MRN: [id number] DOB:March 13, 1976, 46 y.o., female Today's Date: 12/10/2021   PT End of Session - 12/10/21 0758     Visit Number 1    Number of Visits 16    Date for PT Re-Evaluation 02/04/22    Authorization Type [facility]    PT Start Time 0800    PT Stop Time 0840    PT Time Calculation (min) 40 min    Activity Tolerance Patient tolerated treatment well             Past Medical History:  Diagnosis Date   GERD (gastroesophageal reflux disease)    History of cesarean delivery 02/22/2018   Status post repeat low transverse cesarean section 02/23/2018   Past Surgical History:  Procedure Laterality Date   CESAREAN SECTION     CESAREAN SECTION N/A 02/23/2018   Procedure: REPEAT CESAREAN SECTION;  Surgeon: Janyth Contes, MD;  Location: [facility];  Service: Obstetrics;  Laterality: N/A;  RNFA   UPPER GASTROINTESTINAL ENDOSCOPY     WISDOM TOOTH EXTRACTION     Patient Active Problem List   Diagnosis Date Noted   Breast lump 04/13/2018   Status post repeat low transverse cesarean section 02/23/2018   History of cesarean delivery 02/22/2018   Abdominal pain, epigastric 03/14/2015    PCP: Billey Gosling  REFERRING PROVIDER: Lynne Leader  REFERRING DIAG: Neck pain  THERAPY DIAG:  Cervicalgia  Other muscle spasm  Rationale for Evaluation and Treatment Rehabilitation  ONSET DATE:   SUBJECTIVE:                                                                                                                                                                                      SUBJECTIVE STATEMENT: 20 years, ocnstant, day, sleeping- waking her up.  L >R.  Into L rhomboid on L.  Taking muscle relaxer, still waking up.  Denies numbness/tngling.  R handed.  Pt works, mostly on Teaching laboratory technician.    PERTINENT HISTORY:   PAIN:  Are you having pain? Yes: NPRS scale: 8/10 Pain location: Bil neck  L>R.   Pain description: painful,  Aggravating factors: sleeping, most all activity  Relieving factors: none stated   PRECAUTIONS: None  WEIGHT BEARING RESTRICTIONS No   PLOF: Independent  PATIENT GOALS   decreased pain   OBJECTIVE:   DIAGNOSTIC FINDINGS:  Recent MRI.    COGNITION:  Overall cognitive status: Within functional limits for tasks assessed       POSTURE:   UPPER EXTREMITY ROM:   {AROM/PROM:27142} ROM Right eval Left eval  Shoulder flexion    Shoulder extension  Shoulder abduction    Shoulder adduction    Shoulder internal rotation    Shoulder external rotation    Elbow flexion    Elbow extension    Wrist flexion    Wrist extension    Wrist ulnar deviation    Wrist radial deviation    Wrist pronation    Wrist supination    (Blank rows = not tested)  UPPER EXTREMITY MMT:  MMT Right eval Left eval  Shoulder flexion    Shoulder extension    Shoulder abduction    Shoulder adduction    Shoulder internal rotation    Shoulder external rotation    Middle trapezius    Lower trapezius    Elbow flexion    Elbow extension    Wrist flexion    Wrist extension    Wrist ulnar deviation    Wrist radial deviation    Wrist pronation    Wrist supination    Grip strength (lbs)    (Blank rows = not tested)  SHOULDER SPECIAL TESTS:  JOINT MOBILITY TESTING:  ***  PALPATION:  ***   TODAY'S TREATMENT:  Ther ex: see below for HEP Manual: DTM to bil UT and Levator Trigger Point Dry-Needling  Treatment instructions: Expect mild to moderate muscle soreness. S/S of pneumothorax if dry needled over a lung field, and to seek immediate medical attention should they occur. Patient verbalized understanding of these instructions and education.  Patient Consent Given: Yes Education handout provided: Yes Muscles treated: Bil UT and levator  Electrical stimulation performed: No Parameters: N/A Treatment response/outcome: Palpable inc in muscle length, twitch  response.  Modalities: Moist Hot Pack, x 10 min at end of session;     PATIENT EDUCATION: Education details: PT POC, exam findings, HEP, Dry needling.  Person educated: Patient Education method: Explanation, Demonstration, Tactile cues, Verbal cues, and Handouts Education comprehension: verbalized understanding, returned demonstration, verbal cues required, tactile cues required, and needs further education   HOME EXERCISE PROGRAM: Access Code: [email or web address] URL: [email or web address] Date: 12/10/2021 Prepared by: Lyndee Hensen  Exercises - Seated Cervical Sidebending Stretch  - 2 x daily - 3 reps - 30 hold - Seated Levator Scapulae Stretch  - 2 x daily - 3 reps - 30 hold - Seated Scapular Retraction  - 3 x daily - 1 sets - 10 reps - Standing Backward Shoulder Rolls  - 3 x daily - 1 sets - 10 reps - Single Arm Shoulder Post Capsule Stretch  - 2 x daily - 3 reps - 20 hold   ASSESSMENT:  CLINICAL IMPRESSION: Patient is a *** y.o. *** who was seen today for physical therapy evaluation and treatment for ***.    OBJECTIVE IMPAIRMENTS decreased activity tolerance, decreased knowledge of use of DME, decreased mobility, decreased ROM, decreased strength, increased fascial restrictions, increased muscle spasms, impaired flexibility, improper body mechanics, and pain.   ACTIVITY LIMITATIONS carrying, lifting, bending, sleeping, reach over head, and caring for others  PARTICIPATION LIMITATIONS: meal prep, cleaning, laundry, driving, shopping, community activity, occupation, and yard work  PERSONAL FACTORS Time since onset of injury/illness/exacerbation are also affecting patient's functional outcome.   REHAB POTENTIAL: Good  CLINICAL DECISION MAKING: Stable/uncomplicated  EVALUATION COMPLEXITY: Low   GOALS: Goals reviewed with patient? Yes  SHORT TERM GOALS: Target date:  12/24/2021   Pt to be independent with initial HEP  Goal status: INITIAL  2.  Pt to report  taking posture breaks every hour, when at work.   Goal status: INITIAL  LONG TERM GOALS: Target date: 02/04/2022    Pt to be independent with final HEP   Goal status: INITIAL  2.  Pt to report decreased pain to 0-3/10 in neck, with activity, work duties, and sleeping.    Goal status: INITIAL  3.  Pt to demo improved soft tissue limitations to be WNL, to improve pain .   Goal status: INITIAL  4.  Pt to demo improved strength of postural muscles to be at least 4+/5 to improve pain and posture.   Goal status: INITIAL      PLAN: PT FREQUENCY: 1-2x/week  PT DURATION: 8 weeks  PLANNED INTERVENTIONS: Therapeutic exercises, Therapeutic activity, Neuromuscular re-education, Patient/Family education, Joint manipulation, Joint mobilization, DME instructions, Dry Needling, Electrical stimulation, Spinal manipulation, Spinal mobilization, Cryotherapy, Moist heat, Taping, Traction, Ultrasound, Ionotophoresis '4mg'$ /ml Dexamethasone, and Manual therapy  PLAN FOR NEXT SESSION: manual for muscle tension relief, posture, postural strengthening.    Lyndee Hensen, PT, DPT 8:45 AM  12/10/21

## 2021-12-12 ENCOUNTER — Encounter: Payer: Self-pay | Admitting: Physical Therapy

## 2021-12-12 ENCOUNTER — Ambulatory Visit (INDEPENDENT_AMBULATORY_CARE_PROVIDER_SITE_OTHER): Payer: 59 | Admitting: Physical Therapy

## 2021-12-12 DIAGNOSIS — M62838 Other muscle spasm: Secondary | ICD-10-CM | POA: Diagnosis not present

## 2021-12-12 DIAGNOSIS — M542 Cervicalgia: Secondary | ICD-10-CM | POA: Diagnosis not present

## 2021-12-12 NOTE — Therapy (Addendum)
OUTPATIENT PHYSICAL THERAPY TREATMENT    Patient Name: Jessica Warner MRN: [id number] DOB:12/26/1975, 46 y.o., female Today's Date: 12/12/2021   PT End of Session - 12/12/21 0902     Visit Number 2    Number of Visits 16    Date for PT Re-Evaluation 02/04/22    Authorization Type [facility]    PT Start Time 0802    PT Stop Time 0841    PT Time Calculation (min) 39 min    Activity Tolerance Patient tolerated treatment well    Behavior During Therapy [facility] for tasks assessed/performed             Past Medical History:  Diagnosis Date   GERD (gastroesophageal reflux disease)    History of cesarean delivery 02/22/2018   Status post repeat low transverse cesarean section 02/23/2018   Past Surgical History:  Procedure Laterality Date   CESAREAN SECTION     CESAREAN SECTION N/A 02/23/2018   Procedure: REPEAT CESAREAN SECTION;  Surgeon: Janyth Contes, MD;  Location: [place];  Service: Obstetrics;  Laterality: N/A;  RNFA   UPPER GASTROINTESTINAL ENDOSCOPY     WISDOM TOOTH EXTRACTION     Patient Active Problem List   Diagnosis Date Noted   Breast lump 04/13/2018   Status post repeat low transverse cesarean section 02/23/2018   History of cesarean delivery 02/22/2018   Abdominal pain, epigastric 03/14/2015    PCP: Billey Gosling  REFERRING PROVIDER: Lynne Leader  REFERRING DIAG: Neck pain  THERAPY DIAG:  Cervicalgia  Other muscle spasm  Rationale for Evaluation and Treatment Rehabilitation  ONSET DATE:   SUBJECTIVE:                                                                                                                                                                                      SUBJECTIVE STATEMENT: 12/12/2021 Pt states improved tendion and pain in bil UT region after last visit. L rhomboid still very sore.   Eval: 20 years, ocnstant, day, sleeping- waking her up.  L >R.  Into L rhomboid on L.  Taking muscle relaxer, still waking up.   Denies numbness/tngling.  R handed.  Pt works, mostly on Teaching laboratory technician.    PERTINENT HISTORY:   PAIN:  Are you having pain? Yes: NPRS scale: 8/10 Pain location: Bil neck  L>R.  Pain description: painful,  Aggravating factors: sleeping, most all activity  Relieving factors: none stated   PRECAUTIONS: None  WEIGHT BEARING RESTRICTIONS No   PLOF: Independent  PATIENT GOALS   decreased pain   OBJECTIVE:   DIAGNOSTIC FINDINGS:  Recent MRI.    COGNITION:  Overall cognitive status: Within functional  limits for tasks assessed       POSTURE:   UPPER EXTREMITY ROM:   Shoulders: WFL Cervical: WFL, pain with rotation  UPPER EXTREMITY MMT:  Shoulder: 4+/5   SPECIAL TESTS:  Neg ULTT, no radicular pain  JOINT MOBILITY TESTING:  WFL c and t spine   PALPATION:  Tightness and soreness in bil UT L>R,  Tightness and pain in L levator, into rhomboid and upper thoracic paraspinals   TODAY'S TREATMENT: 12/12/2021  Ther ex: Cross arm stretch for L shoulder blade x 1 min; UT and levator stretches x 2 ea bil; Bwd shoulder rolls x 10, scap squeeze x 10, rows RTB x 15;  Quadruped SA presses x 15;    Manual: DTM , TPR, and IASTM to L med scap border, rhomboid, thoracic parapinals, thoracic PA mobs  Trigger Point Dry-Needling  Treatment instructions: Expect mild to moderate muscle soreness. S/S of pneumothorax if dry needled over a lung field, and to seek immediate medical attention should they occur. Patient verbalized understanding of these instructions and education.  Patient Consent Given: Yes Education handout provided: Yes Muscles treated: R mid thoracic paraspinal/longissimus  Electrical stimulation performed: No Parameters: N/A Treatment response/outcome: Palpable inc in muscle length    PATIENT EDUCATION: Education details: updated and reviewed HEP Person educated: Patient Education method: Explanation, Demonstration, Tactile cues, Verbal cues, and  Handouts Education comprehension: verbalized understanding, returned demonstration, verbal cues required, tactile cues required, and needs further education   HOME EXERCISE PROGRAM: Access Code: [email or web address]    ASSESSMENT:  CLINICAL IMPRESSION: Pt with improved tension and pain in UT s  today. She has continued tightness and pain in thoracic paraspinals, addressed with manual tissue release today. She reports relief from Dtm and IASTM. Discussed using heat, massage and massage ball to area for continued muscle release. Pt will return to PT when she gets back from trip.    OBJECTIVE IMPAIRMENTS decreased activity tolerance, decreased knowledge of use of DME, decreased mobility, decreased ROM, decreased strength, increased fascial restrictions, increased muscle spasms, impaired flexibility, improper body mechanics, and pain.   ACTIVITY LIMITATIONS carrying, lifting, bending, sleeping, reach over head, and caring for others  PARTICIPATION LIMITATIONS: meal prep, cleaning, laundry, driving, shopping, community activity, occupation, and yard work  PERSONAL FACTORS Time since onset of injury/illness/exacerbation are also affecting patient's functional outcome.   REHAB POTENTIAL: Good  CLINICAL DECISION MAKING: Stable/uncomplicated  EVALUATION COMPLEXITY: Low   GOALS: Goals reviewed with patient? Yes  SHORT TERM GOALS: Target date:  12/24/2021   Pt to be independent with initial HEP  Goal status: INITIAL  2.  Pt to report taking posture breaks every hour, when at work.   Goal status: INITIAL    LONG TERM GOALS: Target date: 02/04/2022    Pt to be independent with final HEP   Goal status: INITIAL  2.  Pt to report decreased pain to 0-3/10 in neck, with activity, work duties, and sleeping.    Goal status: INITIAL  3.  Pt to demo improved soft tissue limitations to be WNL, to improve pain .   Goal status: INITIAL  4.  Pt to demo improved strength of postural muscles to be at  least 4+/5 to improve pain and posture.   Goal status: INITIAL      PLAN: PT FREQUENCY: 1-2x/week  PT DURATION: 8 weeks  PLANNED INTERVENTIONS: Therapeutic exercises, Therapeutic activity, Neuromuscular re-education, Patient/Family education, Joint manipulation, Joint mobilization, DME instructions, Dry Needling, Electrical stimulation, Spinal manipulation, Spinal mobilization, Cryotherapy, Moist  heat, Taping, Traction, Ultrasound, Ionotophoresis 4mg /ml Dexamethasone, and Manual therapy  PLAN FOR NEXT SESSION: manual for muscle tension relief, posture, postural strengthening.    Lyndee Hensen, PT, DPT 9:03 AM  12/12/21    PHYSICAL THERAPY DISCHARGE SUMMARY  Visits from Start of Care:2 Plan: Patient agrees to discharge.  Patient goals were partially met. Patient is being discharged due to - not returning since last visit.     Lyndee Hensen, PT, DPT 8:56 AM  04/16/22

## 2022-01-21 ENCOUNTER — Other Ambulatory Visit (HOSPITAL_COMMUNITY): Payer: Self-pay

## 2022-02-04 ENCOUNTER — Ambulatory Visit: Payer: 59 | Admitting: Family Medicine

## 2022-03-25 ENCOUNTER — Other Ambulatory Visit (HOSPITAL_COMMUNITY): Payer: Self-pay

## 2022-03-29 ENCOUNTER — Other Ambulatory Visit (HOSPITAL_COMMUNITY): Payer: Self-pay

## 2022-06-12 ENCOUNTER — Other Ambulatory Visit (HOSPITAL_COMMUNITY): Payer: Self-pay

## 2022-06-25 ENCOUNTER — Other Ambulatory Visit (HOSPITAL_COMMUNITY): Payer: Self-pay

## 2022-09-10 ENCOUNTER — Other Ambulatory Visit: Payer: Self-pay

## 2022-09-10 ENCOUNTER — Other Ambulatory Visit (HOSPITAL_COMMUNITY): Payer: Self-pay

## 2022-09-10 MED ORDER — NORETHINDRONE ACET-ETHINYL EST 1-20 MG-MCG PO TABS
1.0000 | ORAL_TABLET | Freq: Every day | ORAL | 0 refills | Status: DC
  Filled 2022-09-10: qty 63, 63d supply, fill #0

## 2022-11-11 ENCOUNTER — Encounter: Payer: Self-pay | Admitting: Internal Medicine

## 2022-11-11 ENCOUNTER — Ambulatory Visit: Payer: 59 | Admitting: Internal Medicine

## 2022-11-11 ENCOUNTER — Other Ambulatory Visit (HOSPITAL_COMMUNITY): Payer: Self-pay

## 2022-11-11 VITALS — BP 118/80 | HR 91 | Temp 98.1°F | Ht 62.0 in | Wt 113.0 lb

## 2022-11-11 DIAGNOSIS — R1013 Epigastric pain: Secondary | ICD-10-CM | POA: Diagnosis not present

## 2022-11-11 DIAGNOSIS — Z1231 Encounter for screening mammogram for malignant neoplasm of breast: Secondary | ICD-10-CM

## 2022-11-11 DIAGNOSIS — Z0001 Encounter for general adult medical examination with abnormal findings: Secondary | ICD-10-CM | POA: Insufficient documentation

## 2022-11-11 DIAGNOSIS — Z0000 Encounter for general adult medical examination without abnormal findings: Secondary | ICD-10-CM

## 2022-11-11 LAB — COMPREHENSIVE METABOLIC PANEL
ALT: 28 U/L (ref 0–35)
AST: 18 U/L (ref 0–37)
Albumin: 4.2 g/dL (ref 3.5–5.2)
Alkaline Phosphatase: 52 U/L (ref 39–117)
BUN: 12 mg/dL (ref 6–23)
CO2: 31 mEq/L (ref 19–32)
Calcium: 9.5 mg/dL (ref 8.4–10.5)
Chloride: 101 mEq/L (ref 96–112)
Creatinine, Ser: 0.68 mg/dL (ref 0.40–1.20)
GFR: 104.84 mL/min (ref >60.00)
Glucose, Bld: 76 mg/dL (ref 70–99)
Potassium: 3.9 mEq/L (ref 3.5–5.1)
Sodium: 141 mEq/L (ref 135–145)
Total Bilirubin: 0.6 mg/dL (ref 0.2–1.2)
Total Protein: 7.1 g/dL (ref 6.0–8.3)

## 2022-11-11 LAB — LIPID PANEL
Cholesterol: 167 mg/dL (ref 0–200)
HDL: 60.5 mg/dL (ref >39.00)
LDL Cholesterol: 78 mg/dL (ref 0–99)
NonHDL: 106.13
Total CHOL/HDL Ratio: 3
Triglycerides: 139 mg/dL (ref 0.0–149.0)
VLDL: 27.8 mg/dL (ref 0.0–40.0)

## 2022-11-11 LAB — CBC
HCT: 41.9 % (ref 36.0–46.0)
Hemoglobin: 14.1 g/dL (ref 12.0–15.0)
MCHC: 33.7 g/dL (ref 30.0–36.0)
MCV: 92.5 fl (ref 78.0–100.0)
Platelets: 287 10*3/uL (ref 150.0–400.0)
RBC: 4.52 Mil/uL (ref 3.87–5.11)
RDW: 13.2 % (ref 11.5–15.5)
WBC: 5.8 10*3/uL (ref 4.0–10.5)

## 2022-11-11 LAB — TSH: TSH: 1.67 u[IU]/mL (ref 0.35–5.50)

## 2022-11-11 MED ORDER — PANTOPRAZOLE SODIUM 40 MG PO TBEC
40.0000 mg | DELAYED_RELEASE_TABLET | Freq: Every day | ORAL | 3 refills | Status: DC
  Filled 2022-11-11: qty 30, 30d supply, fill #0
  Filled 2023-04-07: qty 30, 30d supply, fill #1

## 2022-11-11 NOTE — Patient Instructions (Addendum)
We will start the acid medicine protonix daily to take before breakfast for 2-4 weeks to help the stomach. Let us know in 1 week if not helping.  Let us know about the colon cancer screening if you want to do at home test or the colonoscopy.

## 2022-11-11 NOTE — Assessment & Plan Note (Signed)
Previous episode years ago and now present for 1-2 months. Has not tried anything for this. Sometimes all day and usually present in the morning. Rx protonix 40 mg daily and if no improvement in 1-2 weeks she will let us know.

## 2022-11-11 NOTE — Progress Notes (Signed)
   Subjective:   Patient ID: Jessica Warner, female    DOB: 1975-10-11, 47 y.o.   MRN: [id number]  HPI The patient is a new 47 YO female coming in for upper abdomen pain as well as physical.  PMH, FMH, social history reviewed and updated  Review of Systems  Constitutional: Negative.   HENT: Negative.    Eyes: Negative.   Respiratory:  Negative for cough, chest tightness and shortness of breath.   Cardiovascular:  Negative for chest pain, palpitations and leg swelling.  Gastrointestinal:  Positive for abdominal pain. Negative for abdominal distention, constipation, diarrhea, nausea and vomiting.  Musculoskeletal: Negative.   Skin: Negative.   Neurological: Negative.   Psychiatric/Behavioral: Negative.      Objective:  Physical Exam Constitutional:      Appearance: She is well-developed.  HENT:     Head: Normocephalic and atraumatic.  Cardiovascular:     Rate and Rhythm: Normal rate and regular rhythm.  Pulmonary:     Effort: Pulmonary effort is normal. No respiratory distress.     Breath sounds: Normal breath sounds. No wheezing or rales.  Abdominal:     General: Bowel sounds are normal. There is no distension.     Palpations: Abdomen is soft.     Tenderness: There is abdominal tenderness. There is no rebound.     Comments: Upper epigastric region tender mildly to touch  Musculoskeletal:     Cervical back: Normal range of motion.  Skin:    General: Skin is warm and dry.  Neurological:     Mental Status: She is alert and oriented to person, place, and time.     Coordination: Coordination normal.     Vitals:   11/11/22 0835  BP: 118/80  Pulse: 91  Temp: 98.1 F (36.7 C)  TempSrc: Oral  SpO2: 99%  Weight: 113 lb (51.3 kg)  Height: 5\' 2"  (1.575 m)    Assessment & Plan:

## 2022-11-11 NOTE — Assessment & Plan Note (Signed)
Flu shot yearly. Tetanus up to date. Colonoscopy counseled options she will let us know. Mammogram ordered, pap smear getting records from ob/gyn to see if up to date. Counseled about sun safety and mole surveillance. Counseled about the dangers of distracted driving. Given 10 year screening recommendations.

## 2022-11-21 ENCOUNTER — Other Ambulatory Visit: Payer: Self-pay | Admitting: Internal Medicine

## 2022-11-21 ENCOUNTER — Other Ambulatory Visit (HOSPITAL_COMMUNITY): Payer: Self-pay

## 2022-11-21 MED ORDER — NORETHINDRONE ACET-ETHINYL EST 1-20 MG-MCG PO TABS
1.0000 | ORAL_TABLET | Freq: Every day | ORAL | 4 refills | Status: DC
  Filled 2022-11-21: qty 63, 63d supply, fill #0
  Filled 2023-02-21: qty 63, 63d supply, fill #1
  Filled 2023-04-28: qty 63, 63d supply, fill #2
  Filled 2023-07-03: qty 63, 63d supply, fill #3
  Filled 2023-09-15: qty 63, 63d supply, fill #4

## 2022-11-21 NOTE — Telephone Encounter (Signed)
Pls advise if ok to refill../lmb 

## 2022-12-18 ENCOUNTER — Ambulatory Visit
Admission: RE | Admit: 2022-12-18 | Discharge: 2022-12-18 | Disposition: A | Discharge status: Home / Self Care | Payer: 59 | Source: Ambulatory Visit | Attending: Internal Medicine | Admitting: Internal Medicine

## 2022-12-18 DIAGNOSIS — Z1231 Encounter for screening mammogram for malignant neoplasm of breast: Secondary | ICD-10-CM

## 2022-12-23 ENCOUNTER — Other Ambulatory Visit: Payer: Self-pay | Admitting: Internal Medicine

## 2022-12-23 DIAGNOSIS — R928 Other abnormal and inconclusive findings on diagnostic imaging of breast: Secondary | ICD-10-CM

## 2022-12-31 ENCOUNTER — Ambulatory Visit
Admission: RE | Admit: 2022-12-31 | Discharge: 2022-12-31 | Disposition: A | Discharge status: Home / Self Care | Payer: 59 | Source: Ambulatory Visit | Attending: Internal Medicine | Admitting: Internal Medicine

## 2022-12-31 ENCOUNTER — Ambulatory Visit: Payer: 59

## 2022-12-31 DIAGNOSIS — R928 Other abnormal and inconclusive findings on diagnostic imaging of breast: Secondary | ICD-10-CM

## 2023-01-06 ENCOUNTER — Other Ambulatory Visit: Payer: 59

## 2023-02-21 ENCOUNTER — Other Ambulatory Visit (HOSPITAL_COMMUNITY): Payer: Self-pay

## 2023-04-07 ENCOUNTER — Other Ambulatory Visit (HOSPITAL_COMMUNITY): Payer: Self-pay

## 2023-04-28 ENCOUNTER — Other Ambulatory Visit (HOSPITAL_COMMUNITY): Payer: Self-pay

## 2023-06-19 ENCOUNTER — Encounter: Payer: Self-pay | Admitting: Pediatrics

## 2023-06-20 DIAGNOSIS — H52223 Regular astigmatism, bilateral: Secondary | ICD-10-CM | POA: Diagnosis not present

## 2023-06-20 DIAGNOSIS — H5213 Myopia, bilateral: Secondary | ICD-10-CM | POA: Diagnosis not present

## 2023-06-20 DIAGNOSIS — H524 Presbyopia: Secondary | ICD-10-CM | POA: Diagnosis not present

## 2023-07-03 ENCOUNTER — Ambulatory Visit: Payer: Self-pay | Admitting: Internal Medicine

## 2023-07-03 ENCOUNTER — Other Ambulatory Visit (HOSPITAL_COMMUNITY): Payer: Self-pay

## 2023-07-03 NOTE — Telephone Encounter (Addendum)
 Copied from CRM [id number]. Topic: Clinical - Red Word Triage >> Jul 03, 2023 10:35 AM Corin V wrote: [facility] that prompted transfer to Nurse Triage: Patient is having pain in her stomach. She rates it a 6-7/10 and it is constant in one location throughout the day.   Chief Complaint: Pain Symptoms: Abdominal pain Frequency: Ongoing for at least 6 months but it has gotten worse in the last 2-3 months Pertinent Negatives: Patient denies nausea/vomiting, diarrhea Disposition: [] ED /[] Urgent Care (no appt availability in office) / [x] Appointment(In office/virtual)/ []  [facility]/ [] Home Care/ [] Refused Recommended Disposition /[] [facility]/ []  Follow-up with PCP  Additional Notes: Patient reported moderate pain under her ribs on the left side. The pain is ongoing for at least 6 months but it has gotten worse in the last 2-3 months. Pain level is 5/10. She stated the pain is tolerable. The pain does not radiate and she denies any additional symptoms. Patient reported seeing GI in the past for similar pain, but this pain is in a different location than before.  Appointment scheduled for 1/13. Advised patient to notify the office of any worsening symptoms in the meantime.   Reason for Disposition  Abdominal pain is a chronic symptom (recurrent or ongoing AND present > 4 weeks)  Answer Assessment - Initial Assessment Questions 1. LOCATION: Where does it hurt?      Below ribs on the left side  2. RADIATION: Does the pain shoot anywhere else? (e.g., chest, back)     No  3. ONSET: When did the pain begin? (e.g., minutes, hours or days ago)      Ongoing for about 6 months, worsened within the 2-3 months  4. PATTERN Does the pain come and go, or is it constant?    - If it comes and goes: How long does it last? Do you have pain now?     (Note: Comes and goes means the pain is intermittent. It goes away completely between bouts.)    - If constant: Is it getting  better, staying the same, or getting worse?      (Note: Constant means the pain never goes away completely; most serious pain is constant and gets worse.)      Comes and goes, but the pain occurs often/most of the time  5. SEVERITY: How bad is the pain?  (e.g., Scale 1-10; mild, moderate, or severe)    - MILD (1-3): Doesn't interfere with normal activities, abdomen soft and not tender to touch.     - MODERATE (4-7): Interferes with normal activities or awakens from sleep, abdomen tender to touch.     - SEVERE (8-10): Excruciating pain, doubled over, unable to do any normal activities.       5/10  6. CAUSE: What do you think is causing the stomach pain?     Patient reported hx of GI issue but she is not certain of exact cause  7. RELIEVING/AGGRAVATING FACTORS: What makes it better or worse? (e.g., antacids, bending or twisting motion, bowel movement)     Unknown  8. OTHER SYMPTOMS: Do you have any other symptoms? (e.g., back pain, diarrhea, fever, urination pain, vomiting)       No  Protocols used: Abdominal Pain - Female-A-AH

## 2023-07-04 NOTE — Telephone Encounter (Signed)
 Patient has been scheduled

## 2023-07-14 ENCOUNTER — Ambulatory Visit: Payer: 59 | Admitting: Internal Medicine

## 2023-07-14 ENCOUNTER — Other Ambulatory Visit (HOSPITAL_COMMUNITY): Payer: Self-pay

## 2023-07-14 ENCOUNTER — Encounter: Payer: Self-pay | Admitting: Internal Medicine

## 2023-07-14 ENCOUNTER — Other Ambulatory Visit: Payer: Self-pay

## 2023-07-14 VITALS — BP 100/80 | HR 90 | Temp 98.5°F | Ht 62.0 in | Wt 111.0 lb

## 2023-07-14 DIAGNOSIS — R1013 Epigastric pain: Secondary | ICD-10-CM | POA: Diagnosis not present

## 2023-07-14 LAB — COMPREHENSIVE METABOLIC PANEL
ALT: 15 U/L (ref 0–35)
AST: 16 U/L (ref 0–37)
Albumin: 4.3 g/dL (ref 3.5–5.2)
Alkaline Phosphatase: 46 U/L (ref 39–117)
BUN: 14 mg/dL (ref 6–23)
CO2: 29 meq/L (ref 19–32)
Calcium: 9.1 mg/dL (ref 8.4–10.5)
Chloride: 104 meq/L (ref 96–112)
Creatinine, Ser: 0.66 mg/dL (ref 0.40–1.20)
GFR: 105.1 mL/min (ref >60.00)
Glucose, Bld: 83 mg/dL (ref 70–99)
Potassium: 3.7 meq/L (ref 3.5–5.1)
Sodium: 140 meq/L (ref 135–145)
Total Bilirubin: 0.7 mg/dL (ref 0.2–1.2)
Total Protein: 6.7 g/dL (ref 6.0–8.3)

## 2023-07-14 LAB — CBC
HCT: 42.6 % (ref 36.0–46.0)
Hemoglobin: 14 g/dL (ref 12.0–15.0)
MCHC: 33 g/dL (ref 30.0–36.0)
MCV: 94.1 fL (ref 78.0–100.0)
Platelets: 288 10*3/uL (ref 150.0–400.0)
RBC: 4.52 Mil/uL (ref 3.87–5.11)
RDW: 13.2 % (ref 11.5–15.5)
WBC: 5.5 10*3/uL (ref 4.0–10.5)

## 2023-07-14 MED ORDER — B-2 100 MG PO TABS
ORAL_TABLET | ORAL | 3 refills | Status: DC
  Filled 2023-07-14: qty 100, 100d supply, fill #0

## 2023-07-14 NOTE — Progress Notes (Signed)
   Subjective:   Patient ID: Jessica Warner, female    DOB: [birth year], 48 y.o.   MRN: [id number]  Abdominal Pain Pertinent negatives include no constipation, diarrhea, nausea or vomiting.   The patient is a 48 YO female coming in for epigastric pain and some left mid stomach pain. Seen last year and prescribed protonix  for same which she took 1 month and the pain went away. She then had the pain to come back and took the same medicine which did not help. She has been dealing with this everyday since August. Decided to get checked. Has previously had EGD awhile ago feels she may need another. Appetite normal no blood in stool.   Review of Systems  Constitutional: Negative.   HENT: Negative.    Eyes: Negative.   Respiratory:  Negative for cough, chest tightness and shortness of breath.   Cardiovascular:  Negative for chest pain, palpitations and leg swelling.  Gastrointestinal:  Positive for abdominal pain. Negative for abdominal distention, constipation, diarrhea, nausea and vomiting.  Musculoskeletal: Negative.   Skin: Negative.   Neurological: Negative.   Psychiatric/Behavioral: Negative.      Objective:  Physical Exam Constitutional:      Appearance: She is well-developed.  HENT:     Head: Normocephalic and atraumatic.  Cardiovascular:     Rate and Rhythm: Normal rate and regular rhythm.  Pulmonary:     Effort: Pulmonary effort is normal. No respiratory distress.     Breath sounds: Normal breath sounds. No wheezing or rales.  Abdominal:     General: Bowel sounds are normal. There is no distension.     Palpations: Abdomen is soft.     Tenderness: There is abdominal tenderness in the epigastric area and left upper quadrant. There is no rebound.  Musculoskeletal:     Cervical back: Normal range of motion.  Skin:    General: Skin is warm and dry.  Neurological:     Mental Status: She is alert and oriented to person, place, and time.     Coordination: Coordination normal.      Vitals:   07/14/23 0803  BP: 100/80  Pulse: 90  Temp: 98.5 F (36.9 C)  TempSrc: Oral  SpO2: 99%  Weight: 111 lb (50.3 kg)  Height: 5' 2 (1.575 m)    Assessment & Plan:

## 2023-07-14 NOTE — Assessment & Plan Note (Signed)
 Referral done to GI and will likely need EGD (if possible colonoscopy concurrent for screening). She is using protonix daily for 2 months or so without relief. Checking CBC and CMP for changes.

## 2023-07-14 NOTE — Patient Instructions (Signed)
 We will get you in with [facility] in [place] to check the stomach for ulcers.   Keep taking the protonix in the meantime.   We have sent in the B2 to take daily to help.

## 2023-08-26 ENCOUNTER — Ambulatory Visit: Payer: 59 | Admitting: Gastroenterology

## 2023-09-07 NOTE — Progress Notes (Unsigned)
 [name]   Referring Provider Myrlene Broker, MD [address] [place],  [place] [address]  Primary Care Provider Myrlene Broker, MD  Patient Profile: Jessica Warner is a 48 y.o. female who is seen in consultation in the [facility] at the request of Dr. Okey Dupre for evaluation and management of the problem(s) noted below.  Problem List: Gastric and LUQ abdominal pain History of intestinal metaplasia on antral biopsies 2016; H. pylori negative Colon cancer screening   History of Present Illness   Jessica Warner is a 48 y.o. female with a history of mild gastritis who is referred to the gastroenterology office for evaluation and management of left upper quadrant abdominal pain.  Epigastric and LUQ abdominal pain -- In speaking with Jessica Warner as well as reviewing her prior medical records she was previously seen in our office in 2016 for epigastric abdominal pain -- Previous GI records document an EGD in [place] approximately 2013 - ? 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 -- No improvement on pantoprazole or Carafate; small improvement in symptoms on omeprazole  -- Reports recurrent epigastric and now LUQ abdominal pain progressive since summer 2024 -- Pain initially began in epigastrium and now appears to localize focally to LUQ of abdomen -- Described as being dull in nature -- No association of pain with eating or defecation -- No nausea or vomiting -- Has a history of chronic back pain but does not feel that the abdominal pain is related -- Also does not feel that symptoms are related to reflux or gastritis -- Has not tried OTC medications -- Reviewed labs performed by PCP 07/2023-CBC and CMP normal  -- No personal history of peptic  ulcer disease or H. Pylori -- Reports that her father has had a history of peptic ulcer disease-not sure if he has had H. Pylori -- No family history of gastric cancer  Colorectal cancer screening -- Now 48 years of age and has not had colorectal cancer screening -- Family history of colorectal cancer polyps -- Reviewed options of colonoscopy or stool based testing -she is considering stool based testing but would like to communicate with her insurance company regarding coverage  Last colonoscopy: None Last endoscopy:  03/2015 - mild gastritis; Path H. pylori negative, intestinal metaplasia present in antrum  Last Abd CT/CTE/MRE:  AUS 2016 - normal  GI Review of Symptoms Significant for epigastric and LUQ abdominal pain. Otherwise negative.  General Review of Systems  Review of systems is significant for the pertinent positives and negatives as listed per the HPI.  Full ROS is otherwise negative.  Past Medical History   Past Medical History:  Diagnosis Date   Gastritis 2016   GERD (gastroesophageal reflux disease)    History of cesarean delivery 02/22/2018   Status post repeat low transverse cesarean section 02/23/2018     Past Surgical History   Past Surgical History:  Procedure Laterality Date   CESAREAN SECTION  2009   CESAREAN SECTION N/A 02/23/2018   Procedure: REPEAT CESAREAN SECTION;  Surgeon: Sherian Rein, MD;  [facility]: [facility];  Service: Obstetrics;  Laterality: N/A;  RNFA   UPPER GASTROINTESTINAL ENDOSCOPY     WISDOM TOOTH EXTRACTION       Allergies and Medications  No Known Allergies   Current Meds  Medication Sig  Multiple Vitamin (MULTIVITAMIN) tablet Take 1 tablet by mouth as needed.   norethindrone-ethinyl estradiol (LOESTRIN) 1-20 MG-MCG tablet Take 1 tablet by mouth daily.   Riboflavin (B-2) 100 MG TABS Take 1 tablet by mouth daily    Family History   Family History  Problem Relation Age of Onset   Healthy Mother     Healthy Father    Healthy Maternal Grandmother    Healthy Maternal Grandfather    Healthy Paternal Grandmother    Healthy Paternal Grandfather    Colon cancer Neg Hx    Esophageal cancer Neg Hx    Pancreatic cancer Neg Hx    Rectal cancer Neg Hx    Stomach cancer Neg Hx    Breast cancer Neg Hx      Social History   Social History   Tobacco Use   Smoking status: Never   Smokeless tobacco: Never  Vaping Use   Vaping status: Never Used  Substance Use Topics   Alcohol use: No    Alcohol/week: 0.0 standard drinks of alcohol   Drug use: No   Jessica Warner reports that she has never smoked. She has never used smokeless tobacco. She reports that she does not drink alcohol and does not use drugs.  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 Weight: 112 lb (50.8 kg)  General: Well developed, well nourished, no acute distress Head: Normocephalic and atraumatic Eyes: Sclerae anicteric, EOMI Lungs: Clear throughout to auscultation Heart: Regular rate and rhythm; No murmurs, rubs or bruits Abdomen: Soft, mild tenderness to palpation in the mid left abdomen focally and non distended. No masses, hepatosplenomegaly or hernias noted. Normal Bowel sounds Rectal: Deferred Musculoskeletal: Symmetrical with no gross deformities   Review of Data  The following data was reviewed at the time of this encounter:  Laboratory Studies      Latest Ref Rng & Units 07/14/2023    8:26 AM 11/11/2022    9:17 AM 04/04/2020    5:33 PM  CBC  WBC 4.0 - 10.5 K/uL 5.5  5.8  7.4   Hemoglobin 12.0 - 15.0 g/dL 21.3  08.6  57.8   Hematocrit 36.0 - 46.0 % 42.6  41.9  45.3   Platelets 150.0 - 400.0 K/uL 288.0  287.0  276     Lab Results  Component Value Date   LIPASE 17.0 12/09/2014      Latest Ref Rng & Units 07/14/2023    8:26 AM  11/11/2022    9:17 AM 04/04/2020    5:33 PM  CMP  Glucose 70 - 99 mg/dL 83  76  469   BUN 6 - 23 mg/dL 14  12  15    Creatinine 0.40 - 1.20 mg/dL 6.29  5.28  4.13   Sodium 135 - 145 mEq/L 140  141  138   Potassium 3.5 - 5.1 mEq/L 3.7  3.9  3.9   Chloride 96 - 112 mEq/L 104  101  103   CO2 19 - 32 mEq/L 29  31  26    Calcium 8.4 - 10.5 mg/dL 9.1  9.5  9.1   Total Protein 6.0 - 8.3 g/dL 6.7  7.1  7.5   Total Bilirubin 0.2 - 1.2 mg/dL 0.7  0.6  0.5   Alkaline Phos 39 - 117 U/L  46  52  56   AST 0 - 37 U/L 16  18  22    ALT 0 - 35 U/L 15  28  27       Imaging Studies  AUS 04/2015 Normal  GI Procedures and Studies   EGD 03/3015 Mild gastritis Path: Mild chronic inactive gastritis with intestinal metaplasia, H. pylori negative  Clinical Impression  It is my clinical impression that Ms. Charpentier is a 48 y.o. female with;  Epigastric and LUQ abdominal pain History of intestinal metaplasia on antral biopsies 2016; H. pylori negative Colon cancer screening  Jessica Warner presents to the office for evaluation of epigastric and LUQ abdominal pain.  Her history is pertinent for a previous history of epigastric abdominal pain in 2016.  Workup at that time revealed a negative abdominal ultrasound.  EGD showed mild gastritis endoscopically with biopsies showing chronic inactive atrophic gastritis with mild intestinal metaplasia in the antrum.  Biopsies were negative for H. pylori.  Records suggest that she was managed with PPI and Carafate.  At that time she had a better response to omeprazole then pantoprazole.  Today, she reports ongoing abdominal discomfort progressive since summer 2024 that began in the epigastrium and is now localizing to the left upper quadrant of the abdomen and up to mid abdomen.  She indicates that her abdominal pain at this time seems different than in 2016.  She has not been able to identify any ameliorating or exacerbating factors.  Differential diagnosis may include gastritis,  peptic ulcer disease, H. pylori infection, a pancreatic or splenic disorder, celiac disease, hernia.On exam, pain appears to localized to the left mid abdomen -a renal or gynecologic etiology may be in the differential diagnosis.  At today's visit we discussed further investigating her pain with an abdominal ultrasound to evaluate for anatomic abnormalities.  I have also recommended an upper endoscopy both to investigate her current symptoms of pain and to follow-up history of intestinal metaplasia on antral biopsies in 2016.   We also reviewed options for colorectal cancer screening including colonoscopy and stool based testing in the form of Cologuard.  Preliminarily, she is interested in proceeding with stool based testing but would like to confirm insurance coverage before ordering the test.  She will communicate with her insurance company and her PCP.  Plan  Schedule complete abdominal ultrasound Schedule upper endoscopy with gastric intestinal metaplasia mapping biopsies at [facility] declined trial of PPI in the office today-suggested that she can try Tylenol as needed for abdominal pain but advised against use of NSAIDs in the setting of possible peptic ulcer disease Colorectal cancer screening options reviewed as outlined above-Grace is considering stool based testing.  Planned Follow Up 3 months  The patient or caregiver verbalized understanding of the material covered, with no barriers to understanding. All questions were answered. Patient or caregiver is agreeable with the plan outlined above.    It was a pleasure to see Renarda.  If you have any questions or concerns regarding this evaluation, do not hesitate to contact me.  Maren Beach, MD [facility]

## 2023-09-08 ENCOUNTER — Encounter: Payer: Self-pay | Admitting: Pediatrics

## 2023-09-08 ENCOUNTER — Ambulatory Visit (INDEPENDENT_AMBULATORY_CARE_PROVIDER_SITE_OTHER): Payer: 59 | Admitting: Pediatrics

## 2023-09-08 VITALS — BP 110/68 | HR 84 | Ht 62.5 in | Wt 112.0 lb

## 2023-09-08 DIAGNOSIS — R1013 Epigastric pain: Secondary | ICD-10-CM | POA: Diagnosis not present

## 2023-09-08 DIAGNOSIS — R1012 Left upper quadrant pain: Secondary | ICD-10-CM | POA: Diagnosis not present

## 2023-09-08 DIAGNOSIS — Z1211 Encounter for screening for malignant neoplasm of colon: Secondary | ICD-10-CM

## 2023-09-08 DIAGNOSIS — Z8719 Personal history of other diseases of the digestive system: Secondary | ICD-10-CM

## 2023-09-08 DIAGNOSIS — K31A11 Gastric intestinal metaplasia without dysplasia, involving the antrum: Secondary | ICD-10-CM

## 2023-09-08 NOTE — Patient Instructions (Addendum)
 You have been scheduled for an abdominal ultrasound at [facility] (1st floor of hospital) on Tuesday, 09/16/23 at 7:30 AM. Please arrive 15 minutes prior to your appointment for registration. Make certain not to have anything to eat or drink after midnight the night before your appointment. Should you need to reschedule your appointment, please contact radiology at [phone]. This test typically takes about 30 minutes to perform.   You have been scheduled for an endoscopy. Please follow written instructions given to you at your visit today.  If you use inhalers (even only as needed), please bring them with you on the day of your procedure.  If you take any of the following medications, they will need to be adjusted prior to your procedure:   DO NOT TAKE 7 DAYS PRIOR TO TEST- Trulicity (dulaglutide) Ozempic, Wegovy (semaglutide) Mounjaro (tirzepatide) Bydureon Bcise (exanatide extended release)  DO NOT TAKE 1 DAY PRIOR TO YOUR TEST Rybelsus (semaglutide) Adlyxin (lixisenatide) Victoza (liraglutide) Byetta (exanatide) ___________________________________________________________________________  Thank you for entrusting me with your care and for choosing [facility], Dr. Maren Beach  _______________________________________________________  If your blood pressure at your visit was 140/90 or greater, please contact your primary care physician to follow up on this.  _______________________________________________________  If you are age 5 or older, your body mass index should be between 23-30. Your Body mass index is 20.16 kg/m. If this is out of the aforementioned range listed, please consider follow up with your Primary Care Provider.  If you are age 31 or younger, your body mass index should be between 19-25. Your Body mass index is 20.16 kg/m. If this is out of the aformentioned range listed, please consider follow up with your Primary Care Provider.    ________________________________________________________  The [facility] providers would like to encourage you to use [facility] to communicate with providers for non-urgent requests or questions.  Due to long hold times on the telephone, sending your provider a message by [facility] may be a faster and more efficient way to get a response.  Please allow 48 business hours for a response.  Please remember that this is for non-urgent requests.  _______________________________________________________

## 2023-09-14 ENCOUNTER — Encounter: Payer: Self-pay | Admitting: Pediatrics

## 2023-09-15 ENCOUNTER — Other Ambulatory Visit (HOSPITAL_COMMUNITY): Payer: Self-pay

## 2023-09-15 MED ORDER — OMEPRAZOLE 40 MG PO CPDR
40.0000 mg | DELAYED_RELEASE_CAPSULE | Freq: Every day | ORAL | 2 refills | Status: AC
  Filled 2023-09-15: qty 30, 30d supply, fill #0

## 2023-09-15 NOTE — Addendum Note (Signed)
 Addended by: Marisa Sprinkles on: 09/15/2023 11:33 AM   Modules accepted: Orders

## 2023-09-16 ENCOUNTER — Ambulatory Visit (HOSPITAL_COMMUNITY)
Admission: RE | Admit: 2023-09-16 | Discharge: 2023-09-16 | Disposition: A | Discharge status: Home / Self Care | Source: Ambulatory Visit | Attending: Pediatrics | Admitting: Pediatrics

## 2023-09-16 DIAGNOSIS — R1013 Epigastric pain: Secondary | ICD-10-CM | POA: Insufficient documentation

## 2023-09-16 DIAGNOSIS — R1012 Left upper quadrant pain: Secondary | ICD-10-CM | POA: Diagnosis not present

## 2023-09-22 ENCOUNTER — Encounter: Payer: Self-pay | Admitting: Pediatrics

## 2023-10-01 ENCOUNTER — Encounter: Payer: Self-pay | Admitting: Pediatrics

## 2023-10-08 NOTE — Progress Notes (Unsigned)
 [facility] History and Physical   Primary Care Physician:  Myrlene Broker, MD   Reason for Procedure:  Epigastric and LUQ abdominal pain, history of intestinal metaplasia on antral biopsies 2016  Plan:    Upper endoscopy   HPI: Jessica Warner is a 48 y.o. female undergoing upper endoscopy for evaluation of symptoms of epigastric and LUQ abdominal pain.  Reports symptoms evolved over summer 2024 and have progressed.  Described as dull in nature.  No association with eating or defecation.  No nausea or vomiting.  Abdominal ultrasound without abnormality to explain symptoms.  Abdominal ultrasound without findings to explain symptoms.  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   No family history of gastric cancer  Past Medical History:  Diagnosis Date   Gastritis 2016   GERD (gastroesophageal reflux disease)    History of cesarean delivery 02/22/2018   Status post repeat low transverse cesarean section 02/23/2018    Past Surgical History:  Procedure Laterality Date   CESAREAN SECTION  2009   CESAREAN SECTION N/A 02/23/2018   Procedure: REPEAT CESAREAN SECTION;  Surgeon: Sherian Rein, MD;  Location: [facility];  Service: Obstetrics;  Laterality: N/A;  RNFA   UPPER GASTROINTESTINAL ENDOSCOPY     WISDOM TOOTH EXTRACTION      Prior to Admission medications   Medication Sig Start Date End Date Taking? Authorizing Provider  Multiple Vitamin (MULTIVITAMIN) tablet Take 1 tablet by mouth as needed.    [provider]  norethindrone-ethinyl estradiol (LOESTRIN) 1-20 MG-MCG tablet Take 1 tablet by mouth daily. 11/21/22   Myrlene Broker, MD  omeprazole (PRILOSEC) 40 MG capsule Take 1 capsule (40 mg total) by mouth daily 30 minutes before breakfast. 09/15/23    May, Deanna J, NP  Riboflavin (B-2) 100 MG TABS Take 1 tablet by mouth daily 07/14/23   Myrlene Broker, MD    Current Outpatient Medications  Medication Sig Dispense Refill   Multiple Vitamin (MULTIVITAMIN) tablet Take 1 tablet by mouth as needed.     norethindrone-ethinyl estradiol (LOESTRIN) 1-20 MG-MCG tablet Take 1 tablet by mouth daily. 63 tablet 4   omeprazole (PRILOSEC) 40 MG capsule Take 1 capsule (40 mg total) by mouth daily 30 minutes before breakfast. 30 capsule 2   Riboflavin (B-2) 100 MG TABS Take 1 tablet by mouth daily 90 tablet 3   No current facility-administered medications for this visit.    Allergies as of 10/09/2023   (No Known Allergies)    Family History  Problem Relation Age of Onset   Healthy Mother    Healthy Father    Healthy Maternal Grandmother    Healthy Maternal Grandfather    Healthy Paternal Grandmother    Healthy Paternal Grandfather    Colon cancer Neg Hx    Esophageal cancer Neg Hx    Pancreatic cancer Neg Hx    Rectal cancer Neg Hx    Stomach cancer Neg Hx    Breast cancer Neg Hx     Social History   Socioeconomic History   Marital status: Married    Spouse name: Not on file   Number of children: 2   Years of education: 18   Highest education level: Not on file  Occupational History   Occupation: Public affairs consultant for [employer]  Tobacco Use   Smoking status: Never  Smokeless tobacco: Never  Vaping Use   Vaping status: Never Used  Substance and Sexual Activity   Alcohol use: No    Alcohol/week: 0.0 standard drinks of alcohol   Drug use: No   Sexual activity: Not on file  Other Topics Concern   Not on file  Social History Narrative   Right handed   Fun: Reading, cooking, walking.   Denies religious beliefs effecting health care.       caffeine use: rare   Social Drivers of Corporate investment banker Strain: Low Risk  (02/06/2018)   Overall Financial Resource Strain (CARDIA)    Difficulty of Paying Living  Expenses: Not hard at all  Food Insecurity: No Food Insecurity (02/06/2018)   Hunger Vital Sign    Worried About Running Out of Food in the Last Year: Never true    Ran Out of Food in the Last Year: Never true  Transportation Needs: No Transportation Needs (02/06/2018)   PRAPARE - Administrator, Civil Service (Medical): No    Lack of Transportation (Non-Medical): No  Physical Activity: Inactive (02/23/2018)   Exercise Vital Sign    Days of Exercise per Week: 0 days    Minutes of Exercise per Session: 0 min  Stress: No Stress Concern Present (02/23/2018)   [employer] of Occupational Health - Occupational Stress Questionnaire    Feeling of Stress : Not at all  Social Connections: Somewhat Isolated (02/23/2018)   Social Connection and Isolation Panel [NHANES]    Frequency of Communication with Friends and Family: More than three times a week    Frequency of Social Gatherings with Friends and Family: More than three times a week    Attends Religious Services: Never    Database administrator or Organizations: No    Attends Banker Meetings: Never    Marital Status: Married  Catering manager Violence: Not At Risk (02/06/2018)   Humiliation, Afraid, Rape, and Kick questionnaire    Fear of Current or Ex-Partner: No    Emotionally Abused: No    Physically Abused: No    Sexually Abused: No    Review of Systems:  All other review of systems negative except as mentioned in the HPI.  Physical Exam: Vital signs There were no vitals taken for this visit.  General:   Alert,  Well-developed, well-nourished, pleasant and cooperative in NAD Airway:  Mallampati  Lungs:  Clear throughout to auscultation.   Heart:  Regular rate and rhythm; no murmurs, clicks, rubs,  or gallops. Abdomen:  Soft, nontender and nondistended. Normal bowel sounds.   Neuro/Psych:  Normal mood and affect. A and O x 3  Maren Beach, MD [facility]

## 2023-10-09 ENCOUNTER — Ambulatory Visit: Admitting: Pediatrics

## 2023-10-09 ENCOUNTER — Encounter: Payer: Self-pay | Admitting: Pediatrics

## 2023-10-09 ENCOUNTER — Other Ambulatory Visit: Payer: Self-pay | Admitting: Pediatrics

## 2023-10-09 VITALS — BP 102/69 | HR 70 | Temp 98.0°F | Resp 9 | Ht 62.0 in | Wt 112.0 lb

## 2023-10-09 DIAGNOSIS — K319 Disease of stomach and duodenum, unspecified: Secondary | ICD-10-CM | POA: Diagnosis not present

## 2023-10-09 DIAGNOSIS — K31A11 Gastric intestinal metaplasia without dysplasia, involving the antrum: Secondary | ICD-10-CM

## 2023-10-09 DIAGNOSIS — R1013 Epigastric pain: Secondary | ICD-10-CM | POA: Diagnosis not present

## 2023-10-09 DIAGNOSIS — K3189 Other diseases of stomach and duodenum: Secondary | ICD-10-CM

## 2023-10-09 DIAGNOSIS — K2950 Unspecified chronic gastritis without bleeding: Secondary | ICD-10-CM

## 2023-10-09 DIAGNOSIS — R1012 Left upper quadrant pain: Secondary | ICD-10-CM

## 2023-10-09 DIAGNOSIS — K31A0 Gastric intestinal metaplasia, unspecified: Secondary | ICD-10-CM | POA: Diagnosis not present

## 2023-10-09 MED ORDER — SODIUM CHLORIDE 0.9 % IV SOLN
500.0000 mL | Freq: Once | INTRAVENOUS | Status: DC
Start: 2023-10-09 — End: 2023-10-09

## 2023-10-09 NOTE — Op Note (Addendum)
 [facility] Patient Name: Jessica Warner Procedure Date: 10/09/2023 3:15 PM MRN: [id number] Endoscopist: Maren Beach , MD, [id number] Age: 48 Referring MD:  Date of Birth: [birth year] Gender: Female Account #: [id number] Procedure:                Upper GI endoscopy Indications:              Epigastric abdominal pain, Abdominal pain in the                            left upper quadrant, Gastric intestinal metaplasia                            without dysplasia on EGD biopsies in 2016 Medicines:                Monitored Anesthesia Care Procedure:                Pre-Anesthesia Assessment:                           - Prior to the procedure, a History and Physical                            was performed, and patient medications and                            allergies were reviewed. The patient's tolerance of                            previous anesthesia was also reviewed. The risks                            and benefits of the procedure and the sedation                            options and risks were discussed with the patient.                            All questions were answered, and informed consent                            was obtained. Prior Anticoagulants: The patient has                            taken no anticoagulant or antiplatelet agents. ASA                            Grade Assessment: II - A patient with mild systemic                            disease. After reviewing the risks and benefits,                            the patient was deemed in satisfactory condition to  undergo the procedure.                           After obtaining informed consent, the endoscope was                            passed under direct vision. Throughout the                            procedure, the patient's blood pressure, pulse, and                            oxygen saturations were monitored continuously. The                            GIF [id number]  #[id number] was introduced through the                            mouth, and advanced to the second part of duodenum.                            The upper GI endoscopy was accomplished without                            difficulty. The patient tolerated the procedure                            well. Scope In: Scope Out: Findings:                 The examined esophagus was normal.                           The gastric body, gastric antrum, cardia (on                            retroflexion) and gastric fundus (on retroflexion)                            were normal. Biopsies were obtained on the greater                            curvature of the gastric body, on the lesser                            curvature of the gastric body, at the incisura, on                            the greater curvature of the gastric antrum and on                            the lesser curvature of the gastric antrum with                            cold forceps  for histology.                           The duodenal bulb and second portion of the                            duodenum were normal. Biopsies for histology were                            taken with a cold forceps for evaluation of celiac                            disease. Complications:            No immediate complications. Estimated blood loss:                            Minimal. Estimated Blood Loss:     Estimated blood loss was minimal. Impression:               - Normal esophagus.                           - Normal gastric body, antrum, cardia and gastric                            fundus.                           - Normal duodenal bulb and second portion of the                            duodenum. Biopsied. Evaluate for celiac disease.                           - Biopsies were obtained on the greater curvature                            of the gastric body, on the lesser curvature of the                            gastric body, at the incisura,  on the greater                            curvature of the gastric antrum and on the lesser                            curvature of the gastric antrum. Evaluate for                            gastric intestinal metaplasia and H. pylori. Recommendation:           - Discharge patient to home (ambulatory).                           - Await pathology results.                           -  The findings and recommendations were discussed                            with the patient's family.                           - Patient has a contact number available for                            emergencies. The signs and symptoms of potential                            delayed complications were discussed with the                            patient. Return to normal activities tomorrow.                            Written discharge instructions were provided to the                            patient. Maren Beach, MD 10/09/2023 3:36:43 PM This report has been signed electronically.

## 2023-10-09 NOTE — Progress Notes (Signed)
 Pt A/O x 3, gd SR's, pleased with anesthesia, report to RN

## 2023-10-09 NOTE — Patient Instructions (Signed)
 Await pathology results.  Return to normal activities tomorrow.  YOU HAD AN ENDOSCOPIC PROCEDURE TODAY AT THE [facility]:   Refer to the procedure report that was given to you for any specific questions about what was found during the examination.  If the procedure report does not answer your questions, please call your gastroenterologist to clarify.  If you requested that your care partner not be given the details of your procedure findings, then the procedure report has been included in a sealed envelope for you to review at your convenience later.  YOU SHOULD EXPECT: Some feelings of bloating in the abdomen. Passage of more gas than usual.  Walking can help get rid of the air that was put into your GI tract during the procedure and reduce the bloating. If you had a lower endoscopy (such as a colonoscopy or flexible sigmoidoscopy) you may notice spotting of blood in your stool or on the toilet paper. If you underwent a bowel prep for your procedure, you may not have a normal bowel movement for a few days.  Please Note:  You might notice some irritation and congestion in your nose or some drainage.  This is from the oxygen used during your procedure.  There is no need for concern and it should clear up in a day or so.  SYMPTOMS TO REPORT IMMEDIATELY:   Following upper endoscopy (EGD)  Vomiting of blood or coffee ground material  New chest pain or pain under the shoulder blades  Painful or persistently difficult swallowing  New shortness of breath  Fever of 100F or higher  Black, tarry-looking stools  For urgent or emergent issues, a gastroenterologist can be reached at any hour by calling [phone]. Do not use MyChart messaging for urgent concerns.    DIET:  We do recommend a small meal at first, but then you may proceed to your regular diet.  Drink plenty of fluids but you should avoid alcoholic beverages for 24 hours.  ACTIVITY:  You should plan to take it easy for  the rest of today and you should NOT DRIVE or use heavy machinery until tomorrow (because of the sedation medicines used during the test).    FOLLOW UP: Our staff will call the number listed on your records the next business day following your procedure.  We will call around 7:15- 8:00 am to check on you and address any questions or concerns that you may have regarding the information given to you following your procedure. If we do not reach you, we will leave a message.     If any biopsies were taken you will be contacted by phone or by letter within the next 1-3 weeks.  Please call us at [phone] if you have not heard about the biopsies in 3 weeks.    SIGNATURES/CONFIDENTIALITY: You and/or your care partner have signed paperwork which will be entered into your electronic medical record.  These signatures attest to the fact that that the information above on your After Visit Summary has been reviewed and is understood.  Full responsibility of the confidentiality of this discharge information lies with you and/or your care-partner.

## 2023-10-09 NOTE — Progress Notes (Signed)
 Called to room to assist during endoscopic procedure.  Patient ID and intended procedure confirmed with present staff. Received instructions for my participation in the procedure from the performing physician.

## 2023-10-14 ENCOUNTER — Encounter: Payer: Self-pay | Admitting: Pediatrics

## 2023-10-14 ENCOUNTER — Other Ambulatory Visit (HOSPITAL_COMMUNITY): Payer: Self-pay

## 2023-10-14 ENCOUNTER — Telehealth: Payer: Self-pay | Admitting: *Deleted

## 2023-10-14 LAB — SURGICAL PATHOLOGY

## 2023-10-14 MED ORDER — LIDOCAINE VISCOUS HCL 2 % MT SOLN
10.0000 mL | Freq: Four times a day (QID) | OROMUCOSAL | 2 refills | Status: DC
  Filled 2023-10-14: qty 550, 14d supply, fill #0

## 2023-10-14 MED ORDER — MAGIC MOUTHWASH W/LIDOCAINE: 10.0000 mL | Freq: Four times a day (QID) | ORAL | 2 refills | Status: DC

## 2023-10-14 NOTE — Progress Notes (Signed)
 [facility] Return Visit   Referring Provider Adelia Homestead, MD [address] [place],  [place] [address]  Primary Care Provider Adelia Homestead, MD  Patient Profile: Jessica Warner is a 48 y.o. female who returns to the [facility] office for evaluation and management of the problem(s) noted below.  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 pylori negative Abdominal ultrasound performed after last visit was normal and she was referred for EGD   Current GI Meds  Omeprazole  40 mg orally daily Magic mouthwash  Interval History    Throat pain/Epigastric and LUQ abdominal pain -- Jessica Warner underwent EGD 10/09/2023 -endoscopically and histologically normal with the exception of a small fragment of antral biopsies with intestinal metaplasia and chronic inflammation-no H. Pylori -- Discussed that her biopsies are overall reassuring.  While they do not explain her abdominal pain, there is minimal intestinal metaplasia -suggested follow-up EGD in 5 years. 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, discussed changing to alternate antacid therapy and trial of FD guard  -- Jessica Warner states that she had mild discomfort in her throat immediately after her procedure on 4/10 that improved by 4/11 -- On 412 she noted progressive discomfort in her throat and points to the left side of her sternal notch -- Describes a focal, painful sensation in that region when  swallowing liquids, solids or her own saliva -- No dysphagia -- Denies associated respiratory symptoms -- On my exam today I do not palpate any lesions or masses in the neck; she does not have exudates in her posterior oropharynx; uvula is midline and not swollen   Colorectal cancer screening -- Now 49 years of age and has not had colorectal cancer screening -- Family history of colorectal cancer polyps -- Reviewed options of colonoscopy or stool based testing -she is considering stool based testing but would like to communicate with her insurance company regarding coverage  Last colonoscopy: None Last endoscopy:  09/2023 - endoscopically normal  Last Abd CT/CTE/MRE:  AUS 2016 - normal  GI Review of Symptoms Significant for epigastric and LUQ abdominal pain. Otherwise negative.  General Review of Systems  Review of systems is significant for the pertinent positives and negatives as listed per the HPI.  Full ROS is otherwise negative.  Past Medical History   Past Medical History:  Diagnosis Date   Gastritis 2016   GERD (gastroesophageal reflux disease)    History of cesarean delivery 02/22/2018   Status post repeat low transverse cesarean section 02/23/2018     Past Surgical History   Past Surgical History:  Procedure Laterality Date   CESAREAN SECTION  2009   CESAREAN SECTION N/A 02/23/2018   Procedure: REPEAT CESAREAN SECTION;  Surgeon: Margaretmary Shaver, MD;  Location: [facility];  Service: Obstetrics;  Laterality: N/A;  RNFA   UPPER GASTROINTESTINAL ENDOSCOPY     WISDOM TOOTH EXTRACTION       Allergies and Medications  No Known Allergies   Current Meds  Medication Sig   Caraway Oil-Levomenthol (FDGARD) 25-20.75 MG CAPS Take as  directed.   lidocaine  (XYLOCAINE ) 2 % solution    magic mouthwash (lidocaine , diphenhydrAMINE , alum & mag hydroxide) suspension Take 10 mLs by mouth 4 (four) times daily as directed.   magic mouthwash w/lidocaine  SOLN Take 10  mLs by mouth 4 (four) times daily.   Multiple Vitamin (MULTIVITAMIN) tablet Take 1 tablet by mouth as needed.   norethindrone -ethinyl estradiol  (LOESTRIN ) 1-20 MG-MCG tablet Take 1 tablet by mouth daily.   omeprazole  (PRILOSEC) 40 MG capsule Take 1 capsule (40 mg total) by mouth daily 30 minutes before breakfast.   pantoprazole  (PROTONIX ) 40 MG tablet Take 1 tablet (40 mg total) by mouth daily.   Riboflavin  (B-2) 100 MG TABS Take 1 tablet by mouth daily     Family History   Family History  Problem Relation Age of Onset   Healthy Mother    Healthy Father    Healthy Maternal Grandmother    Healthy Maternal Grandfather    Healthy Paternal Grandmother    Healthy Paternal Grandfather    Colon cancer Neg Hx    Esophageal cancer Neg Hx    Pancreatic cancer Neg Hx    Rectal cancer Neg Hx    Stomach cancer Neg Hx    Breast cancer Neg Hx    Colon polyps Neg Hx      Social History   Social History   Tobacco Use   Smoking status: Never   Smokeless tobacco: Never  Vaping Use   Vaping status: Never Used  Substance Use Topics   Alcohol use: No    Alcohol/week: 0.0 standard drinks of alcohol   Drug use: No   Jessica Warner reports that she has never smoked. She has never used smokeless tobacco. She reports that she does not drink alcohol and does not use drugs.  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 Weight: 110 lb 6.4 oz (50.1 kg)  General: Well developed, well nourished, no acute distress Head: Normocephalic and atraumatic, no enlarged lymph nodes or palpable lesions on neck Mouth: Normal oropharynx, uvula midline, no exudate or erythema in posterior oropharynx Eyes: Sclerae anicteric, EOMI Lungs: Clear throughout to auscultation Heart: Regular rate and rhythm; No murmurs, rubs or  bruits Abdomen: Soft, mild tenderness to palpation in the mid left abdomen focally and non distended. No masses, hepatosplenomegaly or hernias noted. Normal Bowel sounds Rectal: Deferred Musculoskeletal: Symmetrical with no gross deformities   Review of Data  The following data was reviewed at the time of this encounter:  Laboratory Studies      Latest Ref Rng & Units 07/14/2023    8:26 AM 11/11/2022    9:17 AM 04/04/2020    5:33 PM  CBC  WBC 4.0 - 10.5 K/uL 5.5  5.8  7.4   Hemoglobin 12.0 - 15.0 g/dL 95.6  21.3  08.6   Hematocrit 36.0 - 46.0 % 42.6  41.9  45.3   Platelets 150.0 - 400.0 K/uL 288.0  287.0  276     Lab Results  Component Value Date   LIPASE 17.0 12/09/2014      Latest Ref Rng & Units 07/14/2023    8:26 AM 11/11/2022    9:17 AM 04/04/2020    5:33 PM  CMP  Glucose 70 - 99 mg/dL 83  76  104   BUN 6 - 23 mg/dL 14  12  15    Creatinine 0.40 - 1.20 mg/dL 1.61  0.96  0.45   Sodium 135 - 145 mEq/L 140  141  138   Potassium 3.5 - 5.1 mEq/L 3.7  3.9  3.9   Chloride 96 - 112 mEq/L 104  101  103   CO2 19 - 32 mEq/L 29  31  26    Calcium 8.4 - 10.5 mg/dL 9.1  9.5  9.1   Total Protein 6.0 - 8.3 g/dL 6.7  7.1  7.5   Total Bilirubin 0.2 - 1.2 mg/dL 0.7  0.6  0.5   Alkaline Phos 39 - 117 U/L 46  52  56   AST 0 - 37 U/L 16  18  22    ALT 0 - 35 U/L 15  28  27       Imaging Studies  AUS 04/2015 Normal  GI Procedures and Studies   EGD 09/2023 Endoscopically normal Path: Gastric biopsies overall normal with the exception of small fragment of antral mucosa showing intestinal metaplasia and chronic inflammation; H. pylori negative, normal duodenal biopsies  EGD 03/3015 Mild gastritis Path: Mild chronic inactive gastritis with intestinal metaplasia, H. pylori negative  Clinical Impression  It is my clinical impression that Jessica Warner is a 48 y.o. female with;  Epigastric and LUQ abdominal pain History of intestinal metaplasia on antral biopsies 2016; H. pylori  negative Colon cancer screening  Jessica Warner initially presented to the office for evaluation of epigastric and LUQ abdominal pain.  Her history is pertinent for a previous history of epigastric abdominal pain in 2016.  Workup at that time revealed a negative abdominal ultrasound.  EGD showed mild gastritis endoscopically with biopsies showing chronic inactive atrophic gastritis with mild intestinal metaplasia in the antrum.  Biopsies were negative for H. pylori.  Records suggest that she was managed with PPI and Carafate.  At that time she had a better response to omeprazole  then pantoprazole .  In terms of her more recent symptoms, she underwent EGD and abdominal ultrasound both of which were unremarkable.  Her EGD biopsies showed only 1 fragment of antral mucosa with evidence of intestinal metaplasia.  No H. pylori.  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  We discussed attempting to switch PPI to pantoprazole  to see if this yields better effect.  I also provided samples of FDgard.  Another trial of Carafate could be considered in the future if needed.  Advised a follow-up EGD in approximately 5 years given intestinal metaplasia.  She presents acutely today reporting throat pain after her EGD on 10/09/2023.  She had mild discomfort the first day of her procedure that seem to be improving but then worsened.  She points to a focal area on the left side of her neck near the sternal notch.  It is painful with swallowing solids, liquids or saliva.  No palpable lesions on exam today and I do not appreciate any erythema, exudates or swelling of her uvula on physical exam.  We discussed that she could be experiencing tissue trauma related to her endoscopy.  An interval viral infection may also be in the differential diagnosis although I do not appreciate any oropharyngeal abnormalities.  She does not have any hoarseness or shortness of breath.  In the  short-term I have recommended conservative management with Magic mouthwash.  If symptoms are not improving or worsening can consider ENT referral  for video laryngoscopy.  We also reviewed options for colorectal cancer screening including colonoscopy and stool based testing in the form of Cologuard.  Preliminarily, she is interested in proceeding with stool based testing but would like to confirm insurance coverage before ordering the test.  She will communicate with her insurance company and her PCP.  Plan  Magic mouthwash 10 mL p.o. 4 times daily for throat pain If throat pain is not improving or worsening refer to ENT for video laryngoscopy Discontinue omeprazole  and trial pantoprazole  40 mg p.o. daily Consider Carafate if abdominal discomfort is not improving Repeat EGD in 2030 for reevaluation of gastric intestinal metaplasia. Colorectal cancer screening options reviewed at last visit 09/08/23 as outlined above-Jessica Warner is considering stool based testing.  Planned Follow Up 3 months  The patient or caregiver verbalized understanding of the material covered, with no barriers to understanding. All questions were answered. Patient or caregiver is agreeable with the plan outlined above.    It was a pleasure to see Sherran.  If you have any questions or concerns regarding this evaluation, do not hesitate to contact me.  Eugenia Hess, MD [facility]

## 2023-10-14 NOTE — Telephone Encounter (Signed)
  Follow up Call-     10/09/2023    2:37 PM  Call back number  Post procedure Call Back phone  # [phone]  Permission to leave phone message Yes     Patient questions:  Do you have a fever, pain , or abdominal swelling? yes Pain Score  8 *  Have you tolerated food without any problems? Yes.    Have you been able to return to your normal activities? Yes.    Do you have any questions about your discharge instructions: Diet   No. Medications  No. Follow up visit  No.  Do you have questions or concerns about your Care? No.  Actions: * If pain score is 4 or above: Physician/ provider Notified : Eugenia Hess, MD.  Patient stated starting Saturday her throat is very sore and has progressively gotten worse. Complains of pain mostly with swallowing but eating/drinking ok.  Patient stated she was going to send a  mychart message this morning to Dr. Yvone Herd letting her  know.  Will forward to Dr. Yvone Herd to lookout for mychart message from patient.

## 2023-10-15 ENCOUNTER — Other Ambulatory Visit (HOSPITAL_COMMUNITY): Payer: Self-pay

## 2023-10-15 ENCOUNTER — Ambulatory Visit (INDEPENDENT_AMBULATORY_CARE_PROVIDER_SITE_OTHER): Admitting: Pediatrics

## 2023-10-15 ENCOUNTER — Encounter: Payer: Self-pay | Admitting: Pediatrics

## 2023-10-15 VITALS — BP 122/64 | HR 59 | Ht 62.0 in | Wt 110.4 lb

## 2023-10-15 DIAGNOSIS — Z8719 Personal history of other diseases of the digestive system: Secondary | ICD-10-CM

## 2023-10-15 DIAGNOSIS — R070 Pain in throat: Secondary | ICD-10-CM

## 2023-10-15 DIAGNOSIS — R1012 Left upper quadrant pain: Secondary | ICD-10-CM

## 2023-10-15 DIAGNOSIS — R1013 Epigastric pain: Secondary | ICD-10-CM | POA: Diagnosis not present

## 2023-10-15 DIAGNOSIS — K31A0 Gastric intestinal metaplasia, unspecified: Secondary | ICD-10-CM

## 2023-10-15 MED ORDER — FDGARD 25-20.75 MG PO CAPS: ORAL_CAPSULE | ORAL | Status: DC

## 2023-10-15 MED ORDER — PANTOPRAZOLE SODIUM 40 MG PO TBEC
40.0000 mg | DELAYED_RELEASE_TABLET | Freq: Every day | ORAL | 1 refills | Status: AC
  Filled 2023-10-15: qty 90, 90d supply, fill #0

## 2023-10-15 NOTE — Patient Instructions (Addendum)
 We have sent the following medications to your pharmacy for you to pick up at your convenience: Pantoprazole 40 mg daily.  We have given you samples of the following medication to take: FDgard take as directed.  Follow up in 3 months.  Thank you for entrusting me with your care and for choosing [facility], Dr. Eugenia Hess   _______________________________________________________  If your blood pressure at your visit was 140/90 or greater, please contact your primary care physician to follow up on this.  _______________________________________________________  If you are age 46 or older, your body mass index should be between 23-30. Your Body mass index is 20.19 kg/m. If this is out of the aforementioned range listed, please consider follow up with your Primary Care Provider.  If you are age 39 or younger, your body mass index should be between 19-25. Your Body mass index is 20.19 kg/m. If this is out of the aformentioned range listed, please consider follow up with your Primary Care Provider.   ________________________________________________________  The [facility] providers would like to encourage you to use MYCHART to communicate with providers for non-urgent requests or questions.  Due to long hold times on the telephone, sending your provider a message by [facility] may be a faster and more efficient way to get a response.  Please allow 48 business hours for a response.  Please remember that this is for non-urgent requests.  _______________________________________________________

## 2023-10-23 ENCOUNTER — Encounter: Payer: Self-pay | Admitting: Pediatrics

## 2023-11-12 ENCOUNTER — Encounter: Payer: 59 | Admitting: Internal Medicine

## 2023-11-13 ENCOUNTER — Encounter: Payer: 59 | Admitting: Internal Medicine

## 2023-11-14 ENCOUNTER — Encounter: Payer: Self-pay | Admitting: Internal Medicine

## 2023-11-14 ENCOUNTER — Other Ambulatory Visit (HOSPITAL_COMMUNITY): Payer: Self-pay

## 2023-11-14 ENCOUNTER — Encounter: Admitting: Internal Medicine

## 2023-11-14 ENCOUNTER — Ambulatory Visit: Payer: Self-pay | Admitting: Internal Medicine

## 2023-11-14 ENCOUNTER — Ambulatory Visit (INDEPENDENT_AMBULATORY_CARE_PROVIDER_SITE_OTHER): Admitting: Internal Medicine

## 2023-11-14 VITALS — BP 112/60 | HR 88 | Temp 98.4°F | Ht 62.0 in | Wt 112.0 lb

## 2023-11-14 DIAGNOSIS — Z0000 Encounter for general adult medical examination without abnormal findings: Secondary | ICD-10-CM

## 2023-11-14 DIAGNOSIS — Z0001 Encounter for general adult medical examination with abnormal findings: Secondary | ICD-10-CM

## 2023-11-14 DIAGNOSIS — Z1211 Encounter for screening for malignant neoplasm of colon: Secondary | ICD-10-CM

## 2023-11-14 LAB — LIPID PANEL
Cholesterol: 170 mg/dL (ref 0–200)
HDL: 57.9 mg/dL (ref >39.00)
LDL Cholesterol: 91 mg/dL (ref 0–99)
NonHDL: 112.16
Total CHOL/HDL Ratio: 3
Triglycerides: 106 mg/dL (ref 0.0–149.0)
VLDL: 21.2 mg/dL (ref 0.0–40.0)

## 2023-11-14 MED ORDER — B-2 100 MG PO TABS
ORAL_TABLET | ORAL | 3 refills | Status: AC
  Filled 2023-11-14: qty 90, 90d supply, fill #0

## 2023-11-14 MED ORDER — NORETHINDRONE ACET-ETHINYL EST 1-20 MG-MCG PO TABS
1.0000 | ORAL_TABLET | Freq: Every day | ORAL | 11 refills | Status: AC
  Filled 2023-11-14 – 2024-02-25 (×2): qty 63, 63d supply, fill #0
  Filled 2024-06-14: qty 63, 63d supply, fill #1

## 2023-11-14 NOTE — Progress Notes (Signed)
   Subjective:   Patient ID: Jessica Warner, female    DOB: [birth year], 48 y.o.   MRN: [id number]  HPI The patient is here for physical.  PMH, [facility], social history reviewed and updated  Review of Systems  Constitutional: Negative.   HENT: Negative.    Eyes: Negative.   Respiratory:  Negative for cough, chest tightness and shortness of breath.   Cardiovascular:  Negative for chest pain, palpitations and leg swelling.  Gastrointestinal:  Negative for abdominal distention, abdominal pain, constipation, diarrhea, nausea and vomiting.  Musculoskeletal: Negative.   Skin: Negative.   Neurological: Negative.   Psychiatric/Behavioral: Negative.      Objective:  Physical Exam Constitutional:      Appearance: She is well-developed.  HENT:     Head: Normocephalic and atraumatic.  Cardiovascular:     Rate and Rhythm: Normal rate and regular rhythm.  Pulmonary:     Effort: Pulmonary effort is normal. No respiratory distress.     Breath sounds: Normal breath sounds. No wheezing or rales.  Abdominal:     General: Bowel sounds are normal. There is no distension.     Palpations: Abdomen is soft.     Tenderness: There is no abdominal tenderness. There is no rebound.  Musculoskeletal:     Cervical back: Normal range of motion.  Skin:    General: Skin is warm and dry.  Neurological:     Mental Status: She is alert and oriented to person, place, and time.     Coordination: Coordination normal.     Vitals:   11/14/23 0834  BP: 112/60  Pulse: 88  Temp: 98.4 F (36.9 C)  TempSrc: Oral  SpO2: 99%  Weight: 112 lb (50.8 kg)  Height: 5\' 2"  (1.575 m)    Assessment & Plan:

## 2023-11-14 NOTE — Assessment & Plan Note (Signed)
 Flu shot yearly. Tetanus up to date. Cologuard ordered. Mammogram up to date, pap smear getting records. Counseled about sun safety and mole surveillance. Counseled about the dangers of distracted driving. Given 10 year screening recommendations.

## 2023-11-17 ENCOUNTER — Other Ambulatory Visit (HOSPITAL_COMMUNITY): Payer: Self-pay

## 2023-11-25 ENCOUNTER — Other Ambulatory Visit (HOSPITAL_COMMUNITY): Payer: Self-pay

## 2023-11-28 ENCOUNTER — Other Ambulatory Visit (HOSPITAL_COMMUNITY): Payer: Self-pay

## 2023-12-11 DIAGNOSIS — Z1211 Encounter for screening for malignant neoplasm of colon: Secondary | ICD-10-CM | POA: Diagnosis not present

## 2023-12-17 LAB — COLOGUARD: COLOGUARD: NEGATIVE

## 2024-01-14 ENCOUNTER — Ambulatory Visit: Admitting: Gastroenterology

## 2024-02-25 ENCOUNTER — Other Ambulatory Visit (HOSPITAL_COMMUNITY): Payer: Self-pay

## 2024-06-14 ENCOUNTER — Other Ambulatory Visit (HOSPITAL_COMMUNITY): Payer: Self-pay

## 2024-11-16 ENCOUNTER — Encounter: Admitting: Internal Medicine
